# Patient Record
Sex: Male | Born: 1940 | Race: Black or African American | Hispanic: No | Marital: Single | Smoking: Current every day smoker
Health system: Southern US, Community
[De-identification: ages and names within clinical notes are randomized; demographics above are authoritative.]

## PROBLEM LIST (undated history)

## (undated) DIAGNOSIS — R319 Hematuria, unspecified: Secondary | ICD-10-CM

## (undated) DIAGNOSIS — R0602 Shortness of breath: Secondary | ICD-10-CM

## (undated) DIAGNOSIS — J189 Pneumonia, unspecified organism: Secondary | ICD-10-CM

## (undated) DIAGNOSIS — R569 Unspecified convulsions: Secondary | ICD-10-CM

## (undated) DIAGNOSIS — H547 Unspecified visual loss: Secondary | ICD-10-CM

## (undated) DIAGNOSIS — J449 Chronic obstructive pulmonary disease, unspecified: Secondary | ICD-10-CM

## (undated) DIAGNOSIS — I10 Essential (primary) hypertension: Secondary | ICD-10-CM

## (undated) DIAGNOSIS — R51 Headache: Secondary | ICD-10-CM

---

## 2009-06-14 DEATH — deceased

## 2010-08-14 DIAGNOSIS — R319 Hematuria, unspecified: Secondary | ICD-10-CM

## 2010-08-14 DIAGNOSIS — J189 Pneumonia, unspecified organism: Secondary | ICD-10-CM

## 2010-08-14 HISTORY — PX: CYSTOSCOPY: SHX5120

## 2010-08-14 HISTORY — DX: Hematuria, unspecified: R31.9

## 2010-08-14 HISTORY — DX: Pneumonia, unspecified organism: J18.9

## 2011-09-22 ENCOUNTER — Encounter (HOSPITAL_COMMUNITY): Payer: Self-pay | Admitting: Pharmacy Technician

## 2011-09-26 ENCOUNTER — Other Ambulatory Visit (HOSPITAL_COMMUNITY): Payer: Self-pay | Admitting: *Deleted

## 2011-09-27 ENCOUNTER — Inpatient Hospital Stay (HOSPITAL_COMMUNITY): Admission: RE | Admit: 2011-09-27 | Discharge: 2011-09-27 | Payer: Self-pay | Source: Ambulatory Visit

## 2011-09-27 ENCOUNTER — Other Ambulatory Visit (HOSPITAL_COMMUNITY): Payer: Self-pay | Admitting: *Deleted

## 2011-09-27 NOTE — Progress Notes (Signed)
Pt was not able to make PAT appt today , it has been rescheduled for Fri, February 15th.

## 2011-09-29 ENCOUNTER — Other Ambulatory Visit: Payer: Self-pay

## 2011-09-29 ENCOUNTER — Encounter (HOSPITAL_COMMUNITY)
Admission: RE | Admit: 2011-09-29 | Discharge: 2011-09-29 | Disposition: A | Discharge status: Home / Self Care | Payer: Medicare Other | Source: Ambulatory Visit | Attending: Oral Surgery | Admitting: Oral Surgery

## 2011-09-29 ENCOUNTER — Encounter (HOSPITAL_COMMUNITY): Payer: Self-pay

## 2011-09-29 ENCOUNTER — Ambulatory Visit (HOSPITAL_COMMUNITY)
Admission: RE | Admit: 2011-09-29 | Discharge: 2011-09-29 | Disposition: A | Discharge status: Home / Self Care | Payer: Medicare Other | Source: Ambulatory Visit | Attending: Anesthesiology | Admitting: Anesthesiology

## 2011-09-29 DIAGNOSIS — Z01810 Encounter for preprocedural cardiovascular examination: Secondary | ICD-10-CM | POA: Insufficient documentation

## 2011-09-29 DIAGNOSIS — Z01818 Encounter for other preprocedural examination: Secondary | ICD-10-CM | POA: Insufficient documentation

## 2011-09-29 DIAGNOSIS — Z01812 Encounter for preprocedural laboratory examination: Secondary | ICD-10-CM | POA: Insufficient documentation

## 2011-09-29 HISTORY — DX: Headache: R51

## 2011-09-29 HISTORY — DX: Pneumonia, unspecified organism: J18.9

## 2011-09-29 HISTORY — DX: Chronic obstructive pulmonary disease, unspecified: J44.9

## 2011-09-29 HISTORY — DX: Essential (primary) hypertension: I10

## 2011-09-29 HISTORY — DX: Shortness of breath: R06.02

## 2011-09-29 HISTORY — DX: Unspecified visual loss: H54.7

## 2011-09-29 HISTORY — DX: Unspecified convulsions: R56.9

## 2011-09-29 HISTORY — DX: Hematuria, unspecified: R31.9

## 2011-09-29 LAB — COMPREHENSIVE METABOLIC PANEL
ALT: 7 U/L (ref 0–53)
AST: 15 U/L (ref 0–37)
Albumin: 4.3 g/dL (ref 3.5–5.2)
CO2: 24 mEq/L (ref 19–32)
Chloride: 101 mEq/L (ref 96–112)
Creatinine, Ser: 0.95 mg/dL (ref 0.50–1.35)
GFR calc non Af Amer: 84 mL/min — ABNORMAL LOW (ref >90)
Potassium: 4.4 mEq/L (ref 3.5–5.1)
Sodium: 135 mEq/L (ref 135–145)
Total Bilirubin: 0.3 mg/dL (ref 0.3–1.2)

## 2011-09-29 LAB — CBC
Platelets: 337 10*3/uL (ref 150–400)
RBC: 4.45 MIL/uL (ref 4.22–5.81)
RDW: 14.5 % (ref 11.5–15.5)
WBC: 6.3 10*3/uL (ref 4.0–10.5)

## 2011-09-29 LAB — SURGICAL PCR SCREEN
MRSA, PCR: NEGATIVE
Staphylococcus aureus: NEGATIVE

## 2011-09-29 NOTE — Progress Notes (Signed)
Pt is a poor historian.  Unsure about procedures he had in the past..  Pt state's that he used to drink too much, but quit last year.  CMET requested.

## 2011-09-29 NOTE — Pre-Procedure Instructions (Signed)
20 Marc Morris  09/29/2011   Your procedure is scheduled on:  Monday, February 18th  Report to [facility] at 8:00am .    Call this number if you have problems the morning of surgery: [phone]   Remember:   Do not eat food:After Midnight.  May have clear liquids: up to 4 Hours before arrival.  Clear liquids include soda, tea, black coffee, apple or grape juice, broth.  Take these medicines the morning of surgery with A SIP OF WATER: Alprazolam, Metoprolol, Tamsulosin, Nexium.  Use Albuterol Inhaler.   Do not wear jewelry, make-up or nail polish.  Do not wear lotions, powders, or perfumes. You may wear deodorant.  Do not shave 48 hours prior to surgery.  Do not bring valuables to the hospital.  Contacts, dentures or bridgework may not be worn into surgery.  Leave suitcase in the car. After surgery it may be brought to your room.  For patients admitted to the hospital, checkout time is 11:00 AM the day of discharge.   Patients discharged the day of surgery will not be allowed to drive home.  Name and phone number of your driver: --    Special Instructions: CHG Shower Use Special Wash: 1/2 bottle night before surgery and 1/2 bottle morning of surgery.   Please read over the following fact sheets that you were given: Pain Booklet, Coughing and Deep Breathing, MRSA Information and Surgical Site Infection Prevention

## 2011-10-02 ENCOUNTER — Ambulatory Visit (HOSPITAL_COMMUNITY): Admission: RE | Admit: 2011-10-02 | Payer: Medicare Other | Source: Ambulatory Visit | Admitting: Oral Surgery

## 2011-10-02 ENCOUNTER — Encounter (HOSPITAL_COMMUNITY): Admission: RE | Payer: Self-pay | Source: Ambulatory Visit

## 2011-10-02 SURGERY — EXTRACTION, TOOTH, MOLAR
Anesthesia: General | Laterality: Left

## 2013-01-23 IMAGING — CR DG CHEST 2V
2 series · 2 of 2 positions shown · non-contrast
Comparison: None.

CLINICAL DATA: Preop for dental surgery, smoking history

CHEST - 2 VIEW

[w chest pa]
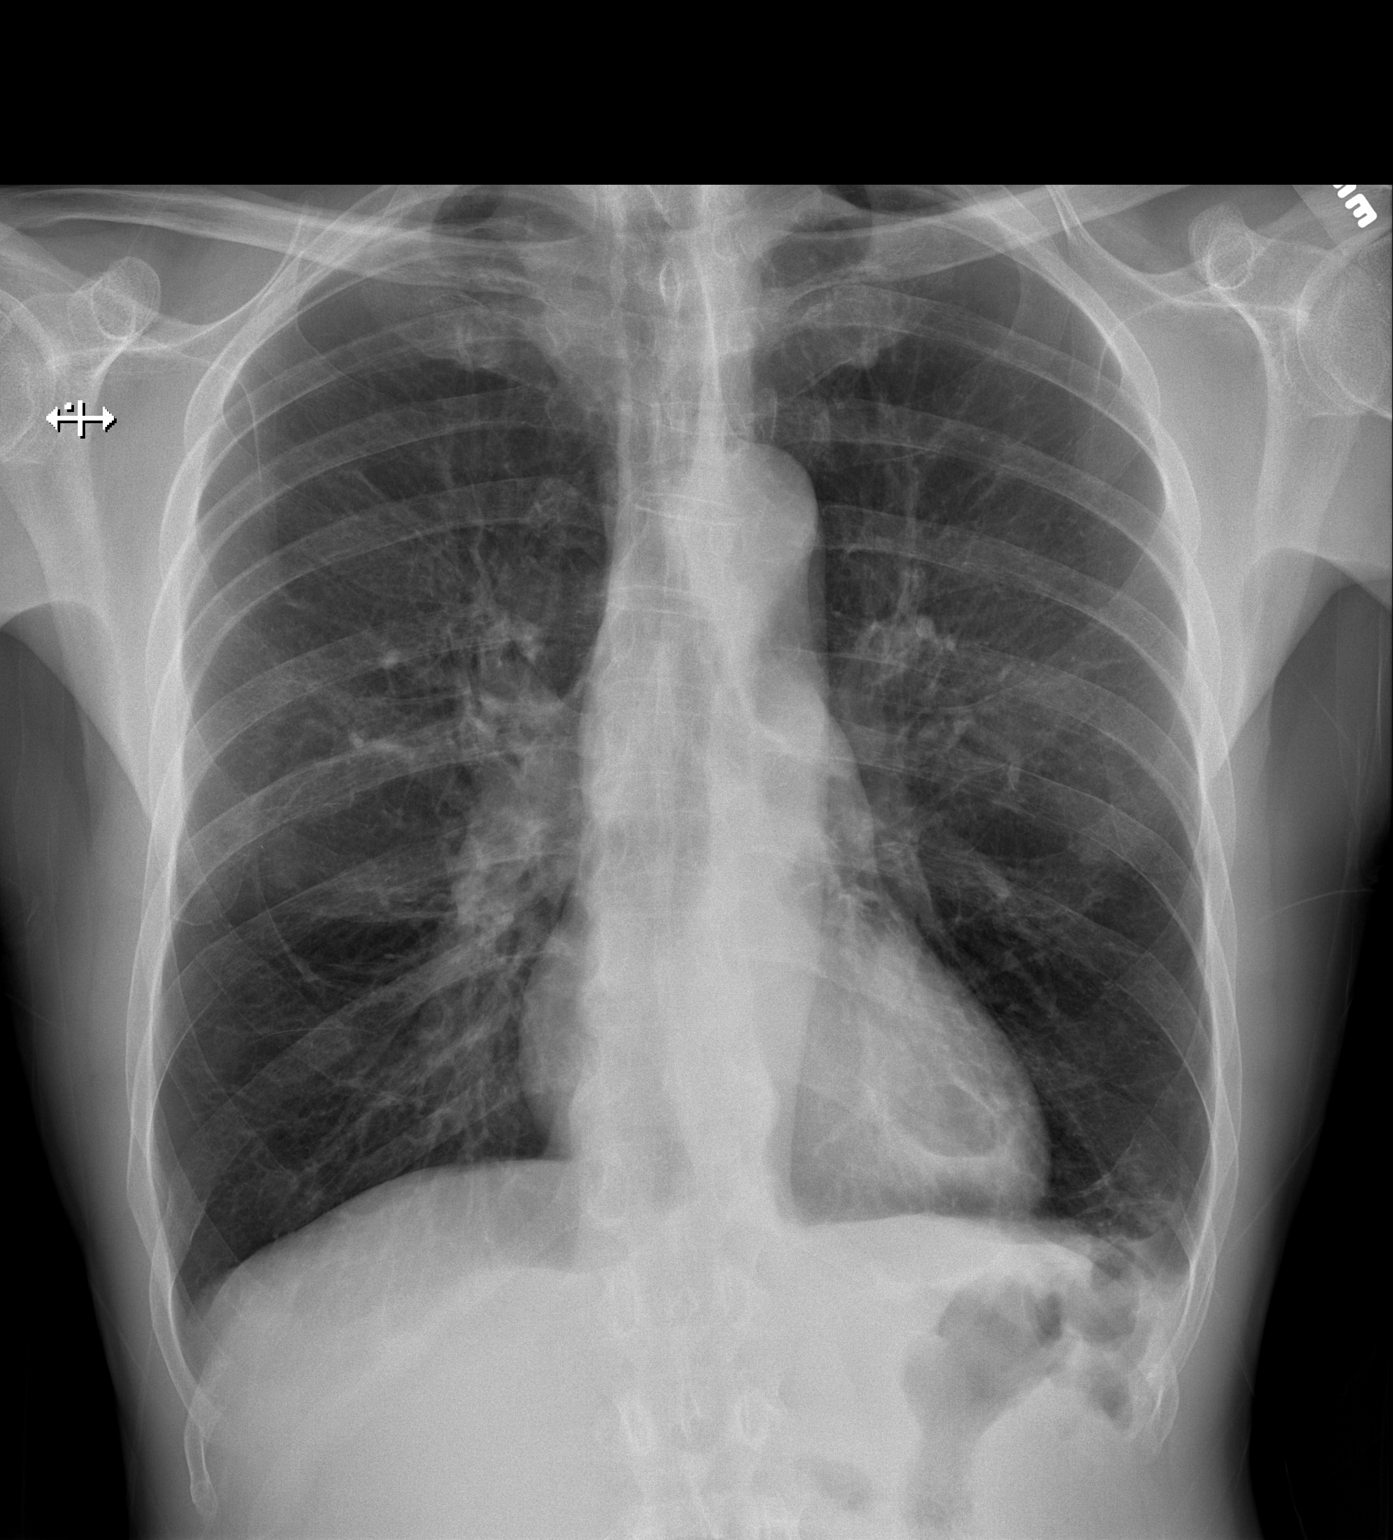

[w chest lat]
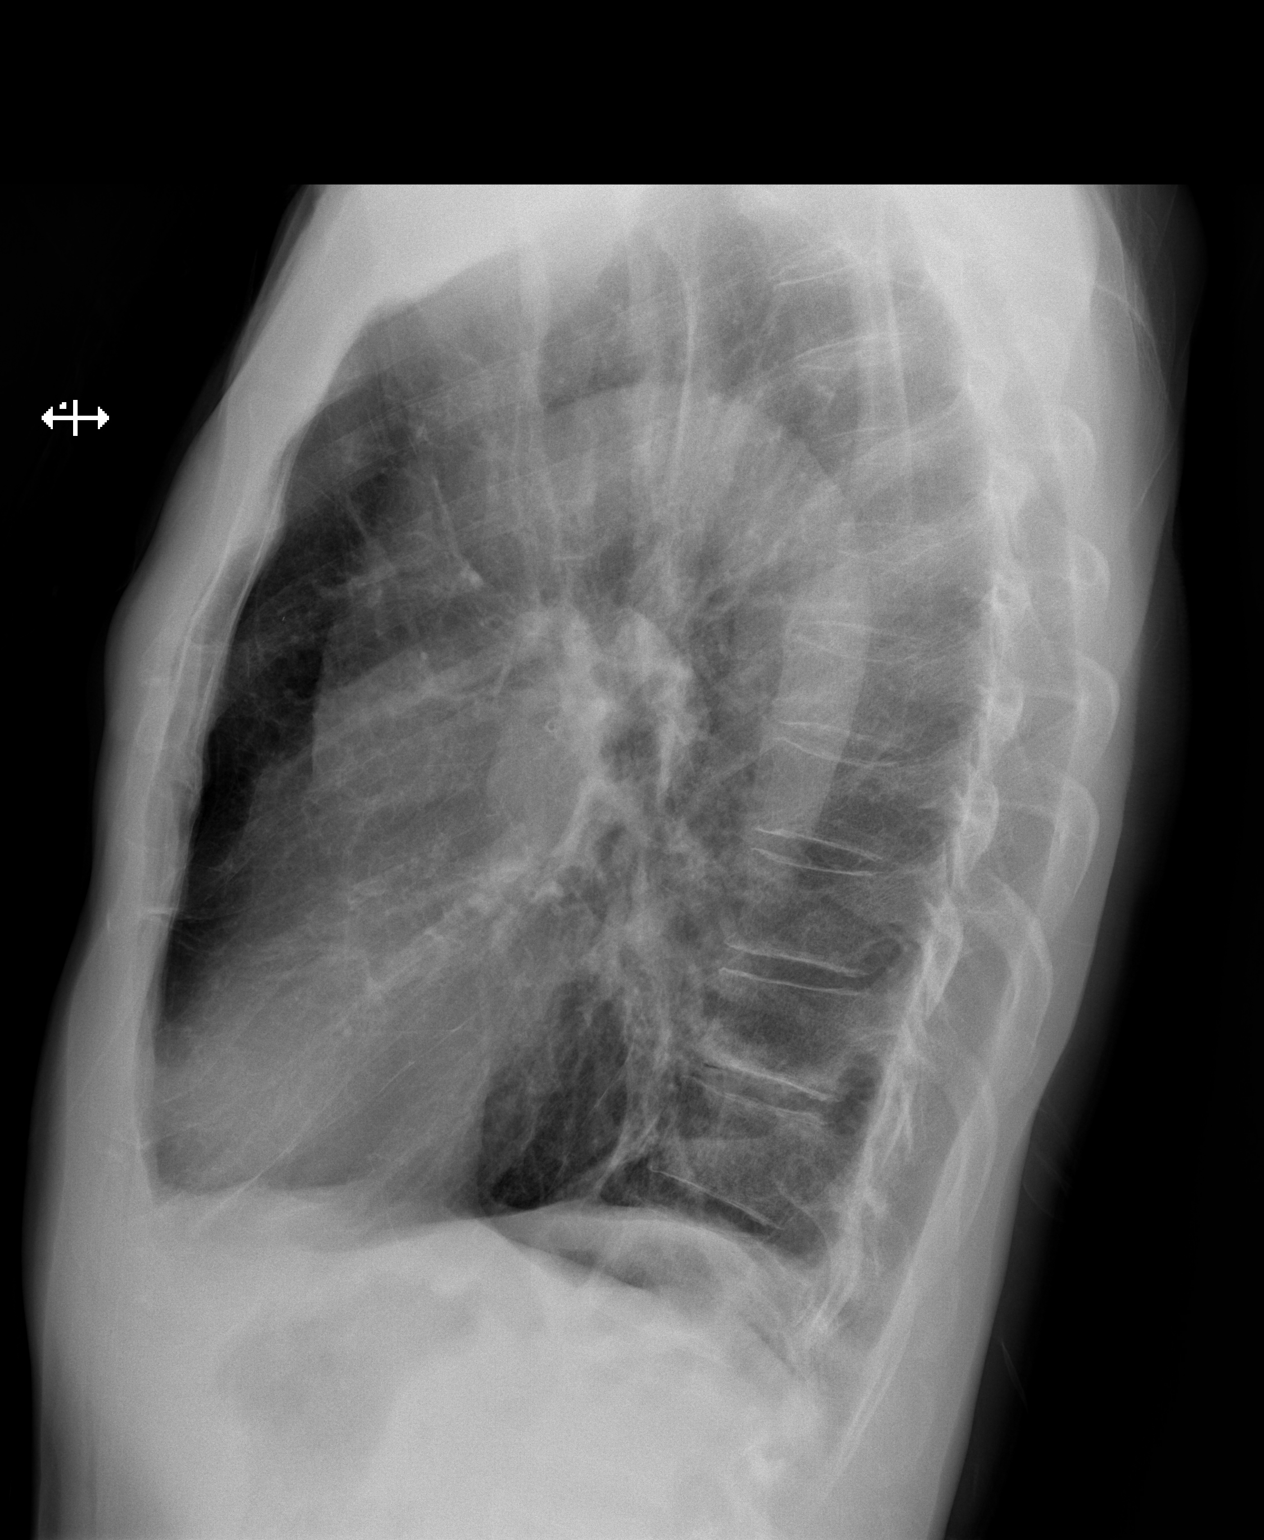

[2 of 2 positions shown; findings below may reference images not displayed]

FINDINGS: The lungs are hyperaerated consistent with COPD.
However, medially at the left lung base there is an air collection
with an air-fluid level present within the left lower lobe. Since
this lesion appears to be a thin-walled, I would favor an infected
bullous over abscess.  Linear atelectasis or scarring is present at
the left lung base as well.  CT of the chest with IV contrast media
is recommended to assess further. The heart is within normal limits
in size.  No bony abnormality is seen.
IMPRESSION: 1.  Lesion at the left lung base with an air-fluid level.  Favor
infected bullous over abscess.  Recommend CT of the chest with IV
contrast media.
2.  COPD.

## 2016-01-22 DIAGNOSIS — J449 Chronic obstructive pulmonary disease, unspecified: Secondary | ICD-10-CM | POA: Diagnosis not present

## 2016-01-22 DIAGNOSIS — J690 Pneumonitis due to inhalation of food and vomit: Secondary | ICD-10-CM

## 2016-01-22 DIAGNOSIS — I2699 Other pulmonary embolism without acute cor pulmonale: Secondary | ICD-10-CM

## 2016-01-22 DIAGNOSIS — D638 Anemia in other chronic diseases classified elsewhere: Secondary | ICD-10-CM | POA: Diagnosis not present

## 2016-01-22 DIAGNOSIS — R64 Cachexia: Secondary | ICD-10-CM

## 2016-01-22 DIAGNOSIS — G92 Toxic encephalopathy: Secondary | ICD-10-CM

## 2016-01-22 DIAGNOSIS — J9621 Acute and chronic respiratory failure with hypoxia: Secondary | ICD-10-CM

## 2016-01-22 DIAGNOSIS — F1020 Alcohol dependence, uncomplicated: Secondary | ICD-10-CM

## 2016-01-22 DIAGNOSIS — K219 Gastro-esophageal reflux disease without esophagitis: Secondary | ICD-10-CM

## 2016-01-22 DIAGNOSIS — I10 Essential (primary) hypertension: Secondary | ICD-10-CM

## 2016-01-23 DIAGNOSIS — J690 Pneumonitis due to inhalation of food and vomit: Secondary | ICD-10-CM | POA: Diagnosis not present

## 2016-01-23 DIAGNOSIS — D638 Anemia in other chronic diseases classified elsewhere: Secondary | ICD-10-CM | POA: Diagnosis not present

## 2016-01-23 DIAGNOSIS — J9621 Acute and chronic respiratory failure with hypoxia: Secondary | ICD-10-CM | POA: Diagnosis not present

## 2016-01-23 DIAGNOSIS — J449 Chronic obstructive pulmonary disease, unspecified: Secondary | ICD-10-CM | POA: Diagnosis not present

## 2016-01-24 DIAGNOSIS — D638 Anemia in other chronic diseases classified elsewhere: Secondary | ICD-10-CM | POA: Diagnosis not present

## 2016-01-24 DIAGNOSIS — J9621 Acute and chronic respiratory failure with hypoxia: Secondary | ICD-10-CM | POA: Diagnosis not present

## 2016-01-24 DIAGNOSIS — J449 Chronic obstructive pulmonary disease, unspecified: Secondary | ICD-10-CM | POA: Diagnosis not present

## 2016-01-24 DIAGNOSIS — J690 Pneumonitis due to inhalation of food and vomit: Secondary | ICD-10-CM | POA: Diagnosis not present

## 2016-01-25 DIAGNOSIS — J449 Chronic obstructive pulmonary disease, unspecified: Secondary | ICD-10-CM | POA: Diagnosis not present

## 2016-01-25 DIAGNOSIS — J690 Pneumonitis due to inhalation of food and vomit: Secondary | ICD-10-CM | POA: Diagnosis not present

## 2016-01-25 DIAGNOSIS — D638 Anemia in other chronic diseases classified elsewhere: Secondary | ICD-10-CM | POA: Diagnosis not present

## 2016-01-25 DIAGNOSIS — J9621 Acute and chronic respiratory failure with hypoxia: Secondary | ICD-10-CM | POA: Diagnosis not present

## 2016-01-26 DIAGNOSIS — J449 Chronic obstructive pulmonary disease, unspecified: Secondary | ICD-10-CM | POA: Diagnosis not present

## 2016-01-26 DIAGNOSIS — J9621 Acute and chronic respiratory failure with hypoxia: Secondary | ICD-10-CM | POA: Diagnosis not present

## 2016-01-26 DIAGNOSIS — J690 Pneumonitis due to inhalation of food and vomit: Secondary | ICD-10-CM | POA: Diagnosis not present

## 2016-01-26 DIAGNOSIS — D638 Anemia in other chronic diseases classified elsewhere: Secondary | ICD-10-CM | POA: Diagnosis not present

## 2016-01-27 DIAGNOSIS — G92 Toxic encephalopathy: Secondary | ICD-10-CM

## 2016-01-27 DIAGNOSIS — J690 Pneumonitis due to inhalation of food and vomit: Secondary | ICD-10-CM

## 2016-01-27 DIAGNOSIS — I2699 Other pulmonary embolism without acute cor pulmonale: Secondary | ICD-10-CM

## 2016-01-27 DIAGNOSIS — F1020 Alcohol dependence, uncomplicated: Secondary | ICD-10-CM

## 2016-01-27 DIAGNOSIS — J449 Chronic obstructive pulmonary disease, unspecified: Secondary | ICD-10-CM | POA: Diagnosis not present

## 2016-01-27 DIAGNOSIS — K219 Gastro-esophageal reflux disease without esophagitis: Secondary | ICD-10-CM

## 2016-01-27 DIAGNOSIS — D638 Anemia in other chronic diseases classified elsewhere: Secondary | ICD-10-CM | POA: Diagnosis not present

## 2016-01-27 DIAGNOSIS — J9621 Acute and chronic respiratory failure with hypoxia: Secondary | ICD-10-CM

## 2016-01-27 DIAGNOSIS — R64 Cachexia: Secondary | ICD-10-CM

## 2016-01-27 DIAGNOSIS — I10 Essential (primary) hypertension: Secondary | ICD-10-CM

## 2016-03-29 DIAGNOSIS — Z720 Tobacco use: Secondary | ICD-10-CM

## 2016-03-29 DIAGNOSIS — D638 Anemia in other chronic diseases classified elsewhere: Secondary | ICD-10-CM

## 2016-03-29 DIAGNOSIS — I2699 Other pulmonary embolism without acute cor pulmonale: Secondary | ICD-10-CM

## 2016-03-29 DIAGNOSIS — K922 Gastrointestinal hemorrhage, unspecified: Secondary | ICD-10-CM | POA: Diagnosis not present

## 2016-03-29 DIAGNOSIS — K219 Gastro-esophageal reflux disease without esophagitis: Secondary | ICD-10-CM

## 2016-03-29 DIAGNOSIS — R42 Dizziness and giddiness: Secondary | ICD-10-CM | POA: Diagnosis not present

## 2016-03-29 DIAGNOSIS — R0600 Dyspnea, unspecified: Secondary | ICD-10-CM | POA: Diagnosis not present

## 2016-03-29 DIAGNOSIS — R531 Weakness: Secondary | ICD-10-CM | POA: Diagnosis not present

## 2016-03-29 DIAGNOSIS — R64 Cachexia: Secondary | ICD-10-CM

## 2016-03-29 DIAGNOSIS — I10 Essential (primary) hypertension: Secondary | ICD-10-CM

## 2016-03-30 DIAGNOSIS — R0600 Dyspnea, unspecified: Secondary | ICD-10-CM | POA: Diagnosis not present

## 2016-03-30 DIAGNOSIS — K922 Gastrointestinal hemorrhage, unspecified: Secondary | ICD-10-CM | POA: Diagnosis not present

## 2016-03-30 DIAGNOSIS — R531 Weakness: Secondary | ICD-10-CM | POA: Diagnosis not present

## 2016-03-30 DIAGNOSIS — R42 Dizziness and giddiness: Secondary | ICD-10-CM | POA: Diagnosis not present

## 2016-03-31 DIAGNOSIS — R531 Weakness: Secondary | ICD-10-CM | POA: Diagnosis not present

## 2016-03-31 DIAGNOSIS — K922 Gastrointestinal hemorrhage, unspecified: Secondary | ICD-10-CM | POA: Diagnosis not present

## 2016-03-31 DIAGNOSIS — R42 Dizziness and giddiness: Secondary | ICD-10-CM | POA: Diagnosis not present

## 2016-03-31 DIAGNOSIS — R0600 Dyspnea, unspecified: Secondary | ICD-10-CM | POA: Diagnosis not present

## 2016-04-01 DIAGNOSIS — R531 Weakness: Secondary | ICD-10-CM | POA: Diagnosis not present

## 2016-04-01 DIAGNOSIS — K922 Gastrointestinal hemorrhage, unspecified: Secondary | ICD-10-CM | POA: Diagnosis not present

## 2016-04-01 DIAGNOSIS — R0600 Dyspnea, unspecified: Secondary | ICD-10-CM | POA: Diagnosis not present

## 2016-04-01 DIAGNOSIS — R42 Dizziness and giddiness: Secondary | ICD-10-CM | POA: Diagnosis not present

## 2017-09-16 DIAGNOSIS — W19XXXA Unspecified fall, initial encounter: Secondary | ICD-10-CM

## 2017-09-16 DIAGNOSIS — Z720 Tobacco use: Secondary | ICD-10-CM | POA: Diagnosis not present

## 2017-09-16 DIAGNOSIS — F10129 Alcohol abuse with intoxication, unspecified: Secondary | ICD-10-CM

## 2017-09-16 DIAGNOSIS — I10 Essential (primary) hypertension: Secondary | ICD-10-CM | POA: Diagnosis not present

## 2017-09-16 DIAGNOSIS — J449 Chronic obstructive pulmonary disease, unspecified: Secondary | ICD-10-CM | POA: Diagnosis not present

## 2017-09-16 DIAGNOSIS — J690 Pneumonitis due to inhalation of food and vomit: Secondary | ICD-10-CM

## 2017-09-16 DIAGNOSIS — S2231XA Fracture of one rib, right side, initial encounter for closed fracture: Secondary | ICD-10-CM | POA: Diagnosis not present

## 2017-09-16 DIAGNOSIS — J9690 Respiratory failure, unspecified, unspecified whether with hypoxia or hypercapnia: Secondary | ICD-10-CM | POA: Diagnosis not present

## 2017-09-17 DIAGNOSIS — S2231XA Fracture of one rib, right side, initial encounter for closed fracture: Secondary | ICD-10-CM | POA: Diagnosis not present

## 2017-09-17 DIAGNOSIS — F10129 Alcohol abuse with intoxication, unspecified: Secondary | ICD-10-CM | POA: Diagnosis not present

## 2017-09-17 DIAGNOSIS — J690 Pneumonitis due to inhalation of food and vomit: Secondary | ICD-10-CM | POA: Diagnosis not present

## 2017-09-17 DIAGNOSIS — W19XXXA Unspecified fall, initial encounter: Secondary | ICD-10-CM | POA: Diagnosis not present

## 2017-09-18 DIAGNOSIS — J690 Pneumonitis due to inhalation of food and vomit: Secondary | ICD-10-CM | POA: Diagnosis not present

## 2017-09-18 DIAGNOSIS — W19XXXA Unspecified fall, initial encounter: Secondary | ICD-10-CM | POA: Diagnosis not present

## 2017-09-18 DIAGNOSIS — F10129 Alcohol abuse with intoxication, unspecified: Secondary | ICD-10-CM | POA: Diagnosis not present

## 2017-09-18 DIAGNOSIS — S2231XA Fracture of one rib, right side, initial encounter for closed fracture: Secondary | ICD-10-CM | POA: Diagnosis not present

## 2017-09-19 DIAGNOSIS — J9690 Respiratory failure, unspecified, unspecified whether with hypoxia or hypercapnia: Secondary | ICD-10-CM | POA: Diagnosis not present

## 2017-09-19 DIAGNOSIS — Z720 Tobacco use: Secondary | ICD-10-CM | POA: Diagnosis not present

## 2017-09-19 DIAGNOSIS — S2231XA Fracture of one rib, right side, initial encounter for closed fracture: Secondary | ICD-10-CM | POA: Diagnosis not present

## 2017-09-19 DIAGNOSIS — W19XXXA Unspecified fall, initial encounter: Secondary | ICD-10-CM | POA: Diagnosis not present

## 2017-09-19 DIAGNOSIS — J690 Pneumonitis due to inhalation of food and vomit: Secondary | ICD-10-CM | POA: Diagnosis not present

## 2017-09-19 DIAGNOSIS — F10129 Alcohol abuse with intoxication, unspecified: Secondary | ICD-10-CM | POA: Diagnosis not present

## 2017-09-19 DIAGNOSIS — I10 Essential (primary) hypertension: Secondary | ICD-10-CM | POA: Diagnosis not present

## 2017-09-19 DIAGNOSIS — J449 Chronic obstructive pulmonary disease, unspecified: Secondary | ICD-10-CM | POA: Diagnosis not present

## 2017-09-20 DIAGNOSIS — W19XXXA Unspecified fall, initial encounter: Secondary | ICD-10-CM | POA: Diagnosis not present

## 2017-09-20 DIAGNOSIS — J690 Pneumonitis due to inhalation of food and vomit: Secondary | ICD-10-CM | POA: Diagnosis not present

## 2017-09-20 DIAGNOSIS — S2231XA Fracture of one rib, right side, initial encounter for closed fracture: Secondary | ICD-10-CM | POA: Diagnosis not present

## 2017-09-20 DIAGNOSIS — F10129 Alcohol abuse with intoxication, unspecified: Secondary | ICD-10-CM | POA: Diagnosis not present

## 2017-09-21 DIAGNOSIS — W19XXXA Unspecified fall, initial encounter: Secondary | ICD-10-CM | POA: Diagnosis not present

## 2017-09-21 DIAGNOSIS — S2231XA Fracture of one rib, right side, initial encounter for closed fracture: Secondary | ICD-10-CM | POA: Diagnosis not present

## 2017-09-21 DIAGNOSIS — J690 Pneumonitis due to inhalation of food and vomit: Secondary | ICD-10-CM | POA: Diagnosis not present

## 2017-09-21 DIAGNOSIS — F10129 Alcohol abuse with intoxication, unspecified: Secondary | ICD-10-CM | POA: Diagnosis not present

## 2017-09-22 DIAGNOSIS — F10129 Alcohol abuse with intoxication, unspecified: Secondary | ICD-10-CM | POA: Diagnosis not present

## 2017-09-22 DIAGNOSIS — S2231XA Fracture of one rib, right side, initial encounter for closed fracture: Secondary | ICD-10-CM | POA: Diagnosis not present

## 2017-09-22 DIAGNOSIS — J690 Pneumonitis due to inhalation of food and vomit: Secondary | ICD-10-CM | POA: Diagnosis not present

## 2017-09-22 DIAGNOSIS — W19XXXA Unspecified fall, initial encounter: Secondary | ICD-10-CM | POA: Diagnosis not present

## 2017-09-23 DIAGNOSIS — S2231XA Fracture of one rib, right side, initial encounter for closed fracture: Secondary | ICD-10-CM | POA: Diagnosis not present

## 2017-09-23 DIAGNOSIS — J690 Pneumonitis due to inhalation of food and vomit: Secondary | ICD-10-CM | POA: Diagnosis not present

## 2017-09-23 DIAGNOSIS — F10129 Alcohol abuse with intoxication, unspecified: Secondary | ICD-10-CM | POA: Diagnosis not present

## 2017-09-23 DIAGNOSIS — W19XXXA Unspecified fall, initial encounter: Secondary | ICD-10-CM | POA: Diagnosis not present

## 2018-02-16 DIAGNOSIS — E162 Hypoglycemia, unspecified: Secondary | ICD-10-CM

## 2018-02-16 DIAGNOSIS — R112 Nausea with vomiting, unspecified: Secondary | ICD-10-CM | POA: Diagnosis not present

## 2018-02-16 DIAGNOSIS — K219 Gastro-esophageal reflux disease without esophagitis: Secondary | ICD-10-CM

## 2018-02-16 DIAGNOSIS — W19XXXA Unspecified fall, initial encounter: Secondary | ICD-10-CM | POA: Diagnosis not present

## 2018-02-16 DIAGNOSIS — F1020 Alcohol dependence, uncomplicated: Secondary | ICD-10-CM

## 2018-02-16 DIAGNOSIS — I10 Essential (primary) hypertension: Secondary | ICD-10-CM

## 2018-02-16 DIAGNOSIS — E860 Dehydration: Secondary | ICD-10-CM | POA: Diagnosis not present

## 2018-02-16 DIAGNOSIS — Z720 Tobacco use: Secondary | ICD-10-CM

## 2018-02-16 DIAGNOSIS — I2699 Other pulmonary embolism without acute cor pulmonale: Secondary | ICD-10-CM

## 2018-02-17 DIAGNOSIS — E440 Moderate protein-calorie malnutrition: Secondary | ICD-10-CM

## 2018-02-17 DIAGNOSIS — E860 Dehydration: Secondary | ICD-10-CM | POA: Diagnosis not present

## 2018-02-17 DIAGNOSIS — J984 Other disorders of lung: Secondary | ICD-10-CM

## 2018-02-17 DIAGNOSIS — W19XXXA Unspecified fall, initial encounter: Secondary | ICD-10-CM | POA: Diagnosis not present

## 2018-02-17 DIAGNOSIS — N400 Enlarged prostate without lower urinary tract symptoms: Secondary | ICD-10-CM

## 2018-02-17 DIAGNOSIS — R296 Repeated falls: Secondary | ICD-10-CM | POA: Diagnosis not present

## 2018-02-17 DIAGNOSIS — I480 Paroxysmal atrial fibrillation: Secondary | ICD-10-CM

## 2018-02-17 DIAGNOSIS — R531 Weakness: Secondary | ICD-10-CM

## 2018-02-17 DIAGNOSIS — F329 Major depressive disorder, single episode, unspecified: Secondary | ICD-10-CM

## 2018-02-17 DIAGNOSIS — F0390 Unspecified dementia without behavioral disturbance: Secondary | ICD-10-CM

## 2018-02-17 DIAGNOSIS — R748 Abnormal levels of other serum enzymes: Secondary | ICD-10-CM

## 2018-02-17 DIAGNOSIS — J449 Chronic obstructive pulmonary disease, unspecified: Secondary | ICD-10-CM

## 2018-02-17 DIAGNOSIS — I5032 Chronic diastolic (congestive) heart failure: Secondary | ICD-10-CM

## 2018-02-18 DIAGNOSIS — W19XXXA Unspecified fall, initial encounter: Secondary | ICD-10-CM | POA: Diagnosis not present

## 2018-02-18 DIAGNOSIS — R296 Repeated falls: Secondary | ICD-10-CM | POA: Diagnosis not present

## 2018-02-18 DIAGNOSIS — E860 Dehydration: Secondary | ICD-10-CM | POA: Diagnosis not present

## 2018-02-18 DIAGNOSIS — J984 Other disorders of lung: Secondary | ICD-10-CM | POA: Diagnosis not present

## 2018-02-19 DIAGNOSIS — J984 Other disorders of lung: Secondary | ICD-10-CM | POA: Diagnosis not present

## 2018-02-19 DIAGNOSIS — W19XXXA Unspecified fall, initial encounter: Secondary | ICD-10-CM | POA: Diagnosis not present

## 2018-02-19 DIAGNOSIS — E860 Dehydration: Secondary | ICD-10-CM | POA: Diagnosis not present

## 2018-02-19 DIAGNOSIS — R296 Repeated falls: Secondary | ICD-10-CM | POA: Diagnosis not present

## 2018-08-08 DIAGNOSIS — D638 Anemia in other chronic diseases classified elsewhere: Secondary | ICD-10-CM

## 2018-08-08 DIAGNOSIS — F1020 Alcohol dependence, uncomplicated: Secondary | ICD-10-CM | POA: Diagnosis not present

## 2018-08-08 DIAGNOSIS — I480 Paroxysmal atrial fibrillation: Secondary | ICD-10-CM

## 2018-08-08 DIAGNOSIS — N400 Enlarged prostate without lower urinary tract symptoms: Secondary | ICD-10-CM

## 2018-08-08 DIAGNOSIS — J441 Chronic obstructive pulmonary disease with (acute) exacerbation: Secondary | ICD-10-CM

## 2018-08-08 DIAGNOSIS — J189 Pneumonia, unspecified organism: Secondary | ICD-10-CM

## 2018-08-08 DIAGNOSIS — I10 Essential (primary) hypertension: Secondary | ICD-10-CM

## 2018-08-08 DIAGNOSIS — K219 Gastro-esophageal reflux disease without esophagitis: Secondary | ICD-10-CM

## 2018-08-08 DIAGNOSIS — Z789 Other specified health status: Secondary | ICD-10-CM | POA: Diagnosis not present

## 2018-08-09 DIAGNOSIS — J441 Chronic obstructive pulmonary disease with (acute) exacerbation: Secondary | ICD-10-CM | POA: Diagnosis not present

## 2018-08-09 DIAGNOSIS — F1020 Alcohol dependence, uncomplicated: Secondary | ICD-10-CM | POA: Diagnosis not present

## 2018-08-09 DIAGNOSIS — J189 Pneumonia, unspecified organism: Secondary | ICD-10-CM | POA: Diagnosis not present

## 2018-08-09 DIAGNOSIS — Z789 Other specified health status: Secondary | ICD-10-CM | POA: Diagnosis not present

## 2018-08-10 DIAGNOSIS — J189 Pneumonia, unspecified organism: Secondary | ICD-10-CM | POA: Diagnosis not present

## 2018-08-10 DIAGNOSIS — F1020 Alcohol dependence, uncomplicated: Secondary | ICD-10-CM | POA: Diagnosis not present

## 2018-08-10 DIAGNOSIS — J441 Chronic obstructive pulmonary disease with (acute) exacerbation: Secondary | ICD-10-CM | POA: Diagnosis not present

## 2018-08-10 DIAGNOSIS — Z789 Other specified health status: Secondary | ICD-10-CM | POA: Diagnosis not present

## 2018-08-11 DIAGNOSIS — Z789 Other specified health status: Secondary | ICD-10-CM | POA: Diagnosis not present

## 2018-08-11 DIAGNOSIS — F1020 Alcohol dependence, uncomplicated: Secondary | ICD-10-CM | POA: Diagnosis not present

## 2018-08-11 DIAGNOSIS — J189 Pneumonia, unspecified organism: Secondary | ICD-10-CM | POA: Diagnosis not present

## 2018-08-11 DIAGNOSIS — J441 Chronic obstructive pulmonary disease with (acute) exacerbation: Secondary | ICD-10-CM | POA: Diagnosis not present

## 2018-10-14 DIAGNOSIS — F1020 Alcohol dependence, uncomplicated: Secondary | ICD-10-CM

## 2018-10-14 DIAGNOSIS — F0390 Unspecified dementia without behavioral disturbance: Secondary | ICD-10-CM

## 2018-10-14 DIAGNOSIS — I10 Essential (primary) hypertension: Secondary | ICD-10-CM

## 2018-10-14 DIAGNOSIS — J9621 Acute and chronic respiratory failure with hypoxia: Secondary | ICD-10-CM

## 2018-10-14 DIAGNOSIS — J690 Pneumonitis due to inhalation of food and vomit: Secondary | ICD-10-CM

## 2018-10-14 DIAGNOSIS — F1320 Sedative, hypnotic or anxiolytic dependence, uncomplicated: Secondary | ICD-10-CM

## 2018-10-14 DIAGNOSIS — J441 Chronic obstructive pulmonary disease with (acute) exacerbation: Secondary | ICD-10-CM

## 2018-10-14 DIAGNOSIS — N400 Enlarged prostate without lower urinary tract symptoms: Secondary | ICD-10-CM

## 2018-10-14 DIAGNOSIS — F329 Major depressive disorder, single episode, unspecified: Secondary | ICD-10-CM

## 2018-10-14 DIAGNOSIS — I480 Paroxysmal atrial fibrillation: Secondary | ICD-10-CM

## 2018-10-15 DIAGNOSIS — J9621 Acute and chronic respiratory failure with hypoxia: Secondary | ICD-10-CM | POA: Diagnosis not present

## 2018-10-15 DIAGNOSIS — F1320 Sedative, hypnotic or anxiolytic dependence, uncomplicated: Secondary | ICD-10-CM | POA: Diagnosis not present

## 2018-12-28 DIAGNOSIS — J449 Chronic obstructive pulmonary disease, unspecified: Secondary | ICD-10-CM | POA: Diagnosis not present

## 2018-12-28 DIAGNOSIS — E871 Hypo-osmolality and hyponatremia: Secondary | ICD-10-CM

## 2018-12-28 DIAGNOSIS — F1010 Alcohol abuse, uncomplicated: Secondary | ICD-10-CM | POA: Diagnosis not present

## 2018-12-28 DIAGNOSIS — R197 Diarrhea, unspecified: Secondary | ICD-10-CM

## 2018-12-28 DIAGNOSIS — J9621 Acute and chronic respiratory failure with hypoxia: Secondary | ICD-10-CM | POA: Diagnosis not present

## 2018-12-29 DIAGNOSIS — R197 Diarrhea, unspecified: Secondary | ICD-10-CM | POA: Diagnosis not present

## 2018-12-29 DIAGNOSIS — J9621 Acute and chronic respiratory failure with hypoxia: Secondary | ICD-10-CM | POA: Diagnosis not present

## 2018-12-29 DIAGNOSIS — F1010 Alcohol abuse, uncomplicated: Secondary | ICD-10-CM | POA: Diagnosis not present

## 2018-12-29 DIAGNOSIS — J449 Chronic obstructive pulmonary disease, unspecified: Secondary | ICD-10-CM | POA: Diagnosis not present

## 2018-12-30 DIAGNOSIS — F1010 Alcohol abuse, uncomplicated: Secondary | ICD-10-CM | POA: Diagnosis not present

## 2018-12-30 DIAGNOSIS — R197 Diarrhea, unspecified: Secondary | ICD-10-CM | POA: Diagnosis not present

## 2018-12-30 DIAGNOSIS — J449 Chronic obstructive pulmonary disease, unspecified: Secondary | ICD-10-CM | POA: Diagnosis not present

## 2018-12-30 DIAGNOSIS — J9621 Acute and chronic respiratory failure with hypoxia: Secondary | ICD-10-CM | POA: Diagnosis not present

## 2018-12-31 DIAGNOSIS — J449 Chronic obstructive pulmonary disease, unspecified: Secondary | ICD-10-CM | POA: Diagnosis not present

## 2018-12-31 DIAGNOSIS — F1010 Alcohol abuse, uncomplicated: Secondary | ICD-10-CM | POA: Diagnosis not present

## 2018-12-31 DIAGNOSIS — J9621 Acute and chronic respiratory failure with hypoxia: Secondary | ICD-10-CM | POA: Diagnosis not present

## 2018-12-31 DIAGNOSIS — R197 Diarrhea, unspecified: Secondary | ICD-10-CM | POA: Diagnosis not present

## 2019-01-01 DIAGNOSIS — E871 Hypo-osmolality and hyponatremia: Secondary | ICD-10-CM | POA: Diagnosis not present

## 2019-01-01 DIAGNOSIS — J9621 Acute and chronic respiratory failure with hypoxia: Secondary | ICD-10-CM | POA: Diagnosis not present

## 2019-01-01 DIAGNOSIS — J690 Pneumonitis due to inhalation of food and vomit: Secondary | ICD-10-CM | POA: Diagnosis not present

## 2019-01-01 DIAGNOSIS — F1010 Alcohol abuse, uncomplicated: Secondary | ICD-10-CM | POA: Diagnosis not present

## 2019-01-02 DIAGNOSIS — E871 Hypo-osmolality and hyponatremia: Secondary | ICD-10-CM | POA: Diagnosis not present

## 2019-01-02 DIAGNOSIS — F1010 Alcohol abuse, uncomplicated: Secondary | ICD-10-CM | POA: Diagnosis not present

## 2019-01-02 DIAGNOSIS — J9621 Acute and chronic respiratory failure with hypoxia: Secondary | ICD-10-CM | POA: Diagnosis not present

## 2019-01-02 DIAGNOSIS — J690 Pneumonitis due to inhalation of food and vomit: Secondary | ICD-10-CM | POA: Diagnosis not present

## 2019-01-03 DIAGNOSIS — F1010 Alcohol abuse, uncomplicated: Secondary | ICD-10-CM | POA: Diagnosis not present

## 2019-01-03 DIAGNOSIS — J690 Pneumonitis due to inhalation of food and vomit: Secondary | ICD-10-CM | POA: Diagnosis not present

## 2019-01-03 DIAGNOSIS — E871 Hypo-osmolality and hyponatremia: Secondary | ICD-10-CM | POA: Diagnosis not present

## 2019-01-03 DIAGNOSIS — J9621 Acute and chronic respiratory failure with hypoxia: Secondary | ICD-10-CM | POA: Diagnosis not present

## 2019-02-12 ENCOUNTER — Encounter (HOSPITAL_COMMUNITY)
Admission: RE | Disposition: A | Discharge status: Home Health | Payer: Self-pay | Source: Other Acute Inpatient Hospital | Attending: Internal Medicine

## 2019-02-12 ENCOUNTER — Inpatient Hospital Stay: Admit: 2019-02-12 | Payer: Medicare Other | Admitting: Internal Medicine

## 2019-02-12 ENCOUNTER — Inpatient Hospital Stay: Admit: 2019-02-12 | Payer: Medicare Other | Admitting: Vascular Surgery

## 2019-02-12 ENCOUNTER — Inpatient Hospital Stay (HOSPITAL_COMMUNITY): Payer: Medicare Other | Admitting: Certified Registered Nurse Anesthetist

## 2019-02-12 ENCOUNTER — Inpatient Hospital Stay (HOSPITAL_COMMUNITY)
Admission: RE | Admit: 2019-02-12 | Discharge: 2019-02-18 | DRG: 908 | Disposition: A | Discharge status: Home Health | Payer: Medicare Other | Source: Other Acute Inpatient Hospital | Attending: Internal Medicine | Admitting: Internal Medicine

## 2019-02-12 DIAGNOSIS — R55 Syncope and collapse: Secondary | ICD-10-CM | POA: Diagnosis present

## 2019-02-12 DIAGNOSIS — Z751 Person awaiting admission to adequate facility elsewhere: Secondary | ICD-10-CM | POA: Diagnosis not present

## 2019-02-12 DIAGNOSIS — J449 Chronic obstructive pulmonary disease, unspecified: Secondary | ICD-10-CM | POA: Diagnosis present

## 2019-02-12 DIAGNOSIS — Z9181 History of falling: Secondary | ICD-10-CM

## 2019-02-12 DIAGNOSIS — I10 Essential (primary) hypertension: Secondary | ICD-10-CM | POA: Diagnosis not present

## 2019-02-12 DIAGNOSIS — Z79899 Other long term (current) drug therapy: Secondary | ICD-10-CM | POA: Diagnosis not present

## 2019-02-12 DIAGNOSIS — I2510 Atherosclerotic heart disease of native coronary artery without angina pectoris: Secondary | ICD-10-CM | POA: Diagnosis present

## 2019-02-12 DIAGNOSIS — Y838 Other surgical procedures as the cause of abnormal reaction of the patient, or of later complication, without mention of misadventure at the time of the procedure: Secondary | ICD-10-CM | POA: Diagnosis present

## 2019-02-12 DIAGNOSIS — S15091A Other specified injury of right carotid artery, initial encounter: Secondary | ICD-10-CM | POA: Diagnosis not present

## 2019-02-12 DIAGNOSIS — S15001A Unspecified injury of right carotid artery, initial encounter: Secondary | ICD-10-CM | POA: Diagnosis not present

## 2019-02-12 DIAGNOSIS — J9611 Chronic respiratory failure with hypoxia: Secondary | ICD-10-CM | POA: Diagnosis present

## 2019-02-12 DIAGNOSIS — W19XXXA Unspecified fall, initial encounter: Secondary | ICD-10-CM | POA: Diagnosis present

## 2019-02-12 DIAGNOSIS — Z7901 Long term (current) use of anticoagulants: Secondary | ICD-10-CM

## 2019-02-12 DIAGNOSIS — Y92239 Unspecified place in hospital as the place of occurrence of the external cause: Secondary | ICD-10-CM | POA: Diagnosis present

## 2019-02-12 DIAGNOSIS — F1010 Alcohol abuse, uncomplicated: Secondary | ICD-10-CM | POA: Diagnosis present

## 2019-02-12 DIAGNOSIS — F419 Anxiety disorder, unspecified: Secondary | ICD-10-CM | POA: Diagnosis present

## 2019-02-12 DIAGNOSIS — I5030 Unspecified diastolic (congestive) heart failure: Secondary | ICD-10-CM | POA: Diagnosis present

## 2019-02-12 DIAGNOSIS — I9751 Accidental puncture and laceration of a circulatory system organ or structure during a circulatory system procedure: Secondary | ICD-10-CM | POA: Diagnosis present

## 2019-02-12 DIAGNOSIS — E785 Hyperlipidemia, unspecified: Secondary | ICD-10-CM | POA: Diagnosis present

## 2019-02-12 DIAGNOSIS — F17210 Nicotine dependence, cigarettes, uncomplicated: Secondary | ICD-10-CM | POA: Diagnosis present

## 2019-02-12 DIAGNOSIS — F329 Major depressive disorder, single episode, unspecified: Secondary | ICD-10-CM | POA: Diagnosis present

## 2019-02-12 DIAGNOSIS — I509 Heart failure, unspecified: Secondary | ICD-10-CM | POA: Diagnosis not present

## 2019-02-12 DIAGNOSIS — H547 Unspecified visual loss: Secondary | ICD-10-CM | POA: Diagnosis present

## 2019-02-12 DIAGNOSIS — E876 Hypokalemia: Secondary | ICD-10-CM | POA: Diagnosis not present

## 2019-02-12 DIAGNOSIS — I480 Paroxysmal atrial fibrillation: Secondary | ICD-10-CM | POA: Diagnosis present

## 2019-02-12 DIAGNOSIS — I110 Hypertensive heart disease with heart failure: Secondary | ICD-10-CM | POA: Diagnosis present

## 2019-02-12 DIAGNOSIS — S15391A Other specified injury of right internal jugular vein, initial encounter: Secondary | ICD-10-CM

## 2019-02-12 DIAGNOSIS — S15009A Unspecified injury of unspecified carotid artery, initial encounter: Secondary | ICD-10-CM | POA: Diagnosis present

## 2019-02-12 DIAGNOSIS — F1011 Alcohol abuse, in remission: Secondary | ICD-10-CM | POA: Diagnosis present

## 2019-02-12 DIAGNOSIS — I4891 Unspecified atrial fibrillation: Secondary | ICD-10-CM

## 2019-02-12 DIAGNOSIS — Z1159 Encounter for screening for other viral diseases: Secondary | ICD-10-CM

## 2019-02-12 DIAGNOSIS — R1312 Dysphagia, oropharyngeal phase: Secondary | ICD-10-CM | POA: Diagnosis present

## 2019-02-12 DIAGNOSIS — R42 Dizziness and giddiness: Secondary | ICD-10-CM | POA: Diagnosis not present

## 2019-02-12 DIAGNOSIS — R296 Repeated falls: Secondary | ICD-10-CM | POA: Diagnosis present

## 2019-02-12 HISTORY — PX: ENDARTERECTOMY: SHX5162

## 2019-02-12 HISTORY — PX: VEIN REPAIR: SHX6524

## 2019-02-12 LAB — BASIC METABOLIC PANEL
Anion gap: 17 — ABNORMAL HIGH (ref 5–15)
BUN: 6 mg/dL — ABNORMAL LOW (ref 8–23)
CO2: 22 mmol/L (ref 22–32)
Calcium: 8.6 mg/dL — ABNORMAL LOW (ref 8.9–10.3)
Chloride: 90 mmol/L — ABNORMAL LOW (ref 98–111)
Creatinine, Ser: 0.83 mg/dL (ref 0.61–1.24)
GFR calc Af Amer: >60 mL/min (ref >60)
GFR calc non Af Amer: >60 mL/min (ref >60)
Glucose, Bld: 87 mg/dL (ref 70–99)
Potassium: 6.4 mmol/L (ref 3.5–5.1)
Sodium: 129 mmol/L — ABNORMAL LOW (ref 135–145)

## 2019-02-12 LAB — CBC
HCT: 41.1 % (ref 39.0–52.0)
Hemoglobin: 14.6 g/dL (ref 13.0–17.0)
MCH: 30.6 pg (ref 26.0–34.0)
MCHC: 35.5 g/dL (ref 30.0–36.0)
MCV: 86.2 fL (ref 80.0–100.0)
Platelets: 233 10*3/uL (ref 150–400)
RBC: 4.77 MIL/uL (ref 4.22–5.81)
RDW: 14.8 % (ref 11.5–15.5)
WBC: 7.3 10*3/uL (ref 4.0–10.5)
nRBC: 0 % (ref 0.0–0.2)

## 2019-02-12 LAB — TYPE AND SCREEN
ABO/RH(D): O POS
Antibody Screen: NEGATIVE

## 2019-02-12 LAB — GLUCOSE, CAPILLARY: Glucose-Capillary: 102 mg/dL — ABNORMAL HIGH (ref 70–99)

## 2019-02-12 LAB — ABO/RH: ABO/RH(D): O POS

## 2019-02-12 SURGERY — ENDARTERECTOMY, CAROTID
Anesthesia: General | Laterality: Right

## 2019-02-12 SURGERY — ENDARTERECTOMY, CAROTID
Anesthesia: General | Site: Neck | Laterality: Right

## 2019-02-12 MED ORDER — INSULIN ASPART 100 UNIT/ML IV SOLN
10.0000 [IU] | Freq: Once | INTRAVENOUS | Status: AC
  Administered 2019-02-12: 10 [IU] via INTRAVENOUS

## 2019-02-12 MED ORDER — PHENYLEPHRINE 40 MCG/ML (10ML) SYRINGE FOR IV PUSH (FOR BLOOD PRESSURE SUPPORT)
PREFILLED_SYRINGE | INTRAVENOUS | Status: DC | PRN
  Administered 2019-02-12 (×4): 120 ug via INTRAVENOUS

## 2019-02-12 MED ORDER — ACETAMINOPHEN 650 MG RE SUPP: 650.0000 mg | Freq: Four times a day (QID) | RECTAL | Status: DC | PRN

## 2019-02-12 MED ORDER — METOPROLOL TARTRATE 25 MG PO TABS
25.0000 mg | ORAL_TABLET | Freq: Two times a day (BID) | ORAL | Status: DC
  Administered 2019-02-13 – 2019-02-18 (×11): 25 mg via ORAL
  Filled 2019-02-12 (×11): qty 1

## 2019-02-12 MED ORDER — IPRATROPIUM-ALBUTEROL 20-100 MCG/ACT IN AERS
1.0000 | INHALATION_SPRAY | RESPIRATORY_TRACT | Status: DC | PRN
  Filled 2019-02-12: qty 4

## 2019-02-12 MED ORDER — ALBUTEROL SULFATE HFA 108 (90 BASE) MCG/ACT IN AERS
1.0000 | INHALATION_SPRAY | Freq: Four times a day (QID) | RESPIRATORY_TRACT | Status: DC | PRN
  Administered 2019-02-13 – 2019-02-16 (×2): 1 via RESPIRATORY_TRACT
  Filled 2019-02-12: qty 6.7

## 2019-02-12 MED ORDER — CEFAZOLIN SODIUM-DEXTROSE 2-3 GM-%(50ML) IV SOLR
INTRAVENOUS | Status: DC | PRN
  Administered 2019-02-12: 2 g via INTRAVENOUS

## 2019-02-12 MED ORDER — ALBUTEROL SULFATE (2.5 MG/3ML) 0.083% IN NEBU: 2.5000 mg | INHALATION_SOLUTION | Freq: Four times a day (QID) | RESPIRATORY_TRACT | Status: DC | PRN

## 2019-02-12 MED ORDER — LORAZEPAM 1 MG PO TABS
1.0000 mg | ORAL_TABLET | Freq: Four times a day (QID) | ORAL | Status: AC | PRN
  Administered 2019-02-13 – 2019-02-15 (×3): 1 mg via ORAL
  Filled 2019-02-12 (×3): qty 1

## 2019-02-12 MED ORDER — THIAMINE HCL 100 MG/ML IJ SOLN
100.0000 mg | Freq: Every day | INTRAMUSCULAR | Status: DC
  Administered 2019-02-12 – 2019-02-16 (×3): 100 mg via INTRAVENOUS
  Filled 2019-02-12 (×4): qty 2

## 2019-02-12 MED ORDER — ONDANSETRON HCL 4 MG/2ML IJ SOLN
4.0000 mg | Freq: Four times a day (QID) | INTRAMUSCULAR | Status: DC | PRN
  Administered 2019-02-14 – 2019-02-16 (×3): 4 mg via INTRAVENOUS
  Filled 2019-02-12 (×3): qty 2

## 2019-02-12 MED ORDER — ONDANSETRON HCL 4 MG/2ML IJ SOLN
INTRAMUSCULAR | Status: DC | PRN
  Administered 2019-02-12: 4 mg via INTRAVENOUS

## 2019-02-12 MED ORDER — PHENOL 1.4 % MT LIQD: 1.0000 | OROMUCOSAL | Status: DC | PRN

## 2019-02-12 MED ORDER — ROFLUMILAST 500 MCG PO TABS
500.0000 ug | ORAL_TABLET | Freq: Every day | ORAL | Status: DC
  Administered 2019-02-13 – 2019-02-18 (×7): 500 ug via ORAL
  Filled 2019-02-12 (×7): qty 1

## 2019-02-12 MED ORDER — HYDRALAZINE HCL 20 MG/ML IJ SOLN
10.0000 mg | Freq: Once | INTRAMUSCULAR | Status: AC
  Administered 2019-02-12: 23:00:00 10 mg via INTRAVENOUS
  Filled 2019-02-12: qty 1

## 2019-02-12 MED ORDER — CALCIUM GLUCONATE-NACL 1-0.675 GM/50ML-% IV SOLN
1.0000 g | Freq: Once | INTRAVENOUS | Status: AC
  Administered 2019-02-12: 1000 mg via INTRAVENOUS
  Filled 2019-02-12: qty 50

## 2019-02-12 MED ORDER — LORAZEPAM 2 MG/ML IJ SOLN
0.0000 mg | Freq: Two times a day (BID) | INTRAMUSCULAR | Status: AC
  Filled 2019-02-12: qty 1

## 2019-02-12 MED ORDER — MOMETASONE FURO-FORMOTEROL FUM 200-5 MCG/ACT IN AERO
2.0000 | INHALATION_SPRAY | Freq: Two times a day (BID) | RESPIRATORY_TRACT | Status: DC
  Administered 2019-02-13 – 2019-02-18 (×11): 2 via RESPIRATORY_TRACT
  Filled 2019-02-12: qty 8.8

## 2019-02-12 MED ORDER — ALBUTEROL SULFATE HFA 108 (90 BASE) MCG/ACT IN AERS
INHALATION_SPRAY | RESPIRATORY_TRACT | Status: DC | PRN
  Administered 2019-02-12: 6 via RESPIRATORY_TRACT

## 2019-02-12 MED ORDER — ADULT MULTIVITAMIN W/MINERALS CH
1.0000 | ORAL_TABLET | Freq: Every day | ORAL | Status: DC
  Administered 2019-02-13 – 2019-02-18 (×6): 1 via ORAL
  Filled 2019-02-12 (×6): qty 1

## 2019-02-12 MED ORDER — PROPOFOL 10 MG/ML IV BOLUS
INTRAVENOUS | Status: DC | PRN
  Administered 2019-02-12: 100 mg via INTRAVENOUS
  Administered 2019-02-12: 20 mg via INTRAVENOUS

## 2019-02-12 MED ORDER — ASPIRIN EC 81 MG PO TBEC
81.0000 mg | DELAYED_RELEASE_TABLET | Freq: Every day | ORAL | Status: DC
  Administered 2019-02-13 – 2019-02-14 (×2): 81 mg via ORAL
  Filled 2019-02-12 (×2): qty 1

## 2019-02-12 MED ORDER — FENTANYL CITRATE (PF) 250 MCG/5ML IJ SOLN
INTRAMUSCULAR | Status: DC | PRN
  Administered 2019-02-12: 25 ug via INTRAVENOUS
  Administered 2019-02-12: 50 ug via INTRAVENOUS

## 2019-02-12 MED ORDER — SODIUM CHLORIDE 0.9 % IV SOLN
INTRAVENOUS | Status: DC | PRN
  Administered 2019-02-12: 500 mL

## 2019-02-12 MED ORDER — MIRTAZAPINE 15 MG PO TABS
15.0000 mg | ORAL_TABLET | Freq: Every day | ORAL | Status: DC
  Administered 2019-02-13: 15 mg via ORAL
  Filled 2019-02-12: qty 1

## 2019-02-12 MED ORDER — IPRATROPIUM-ALBUTEROL 0.5-2.5 (3) MG/3ML IN SOLN: 3.0000 mL | RESPIRATORY_TRACT | Status: DC | PRN

## 2019-02-12 MED ORDER — SUGAMMADEX SODIUM 200 MG/2ML IV SOLN
INTRAVENOUS | Status: DC | PRN
  Administered 2019-02-12: 200 mg via INTRAVENOUS

## 2019-02-12 MED ORDER — VITAMIN B-1 100 MG PO TABS
100.0000 mg | ORAL_TABLET | Freq: Every day | ORAL | Status: DC
  Administered 2019-02-13 – 2019-02-18 (×4): 100 mg via ORAL
  Filled 2019-02-12 (×4): qty 1

## 2019-02-12 MED ORDER — CEFAZOLIN SODIUM-DEXTROSE 2-4 GM/100ML-% IV SOLN
2.0000 g | Freq: Three times a day (TID) | INTRAVENOUS | Status: AC
  Administered 2019-02-13 (×2): 2 g via INTRAVENOUS
  Filled 2019-02-12 (×2): qty 100

## 2019-02-12 MED ORDER — SUCCINYLCHOLINE CHLORIDE 200 MG/10ML IV SOSY
PREFILLED_SYRINGE | INTRAVENOUS | Status: DC | PRN
  Administered 2019-02-12: 120 mg via INTRAVENOUS

## 2019-02-12 MED ORDER — FLUOXETINE HCL 20 MG PO CAPS
20.0000 mg | ORAL_CAPSULE | Freq: Every day | ORAL | Status: DC
  Administered 2019-02-13 – 2019-02-18 (×6): 20 mg via ORAL
  Filled 2019-02-12 (×6): qty 1

## 2019-02-12 MED ORDER — SACUBITRIL-VALSARTAN 24-26 MG PO TABS: 1.0000 | ORAL_TABLET | Freq: Every day | ORAL | Status: DC

## 2019-02-12 MED ORDER — PANTOPRAZOLE SODIUM 40 MG PO TBEC
40.0000 mg | DELAYED_RELEASE_TABLET | Freq: Every day | ORAL | Status: DC
  Administered 2019-02-13 – 2019-02-18 (×6): 40 mg via ORAL
  Filled 2019-02-12 (×6): qty 1

## 2019-02-12 MED ORDER — OXYCODONE-ACETAMINOPHEN 5-325 MG PO TABS
1.0000 | ORAL_TABLET | ORAL | Status: DC | PRN
  Administered 2019-02-17 – 2019-02-18 (×2): 2 via ORAL
  Filled 2019-02-12 (×2): qty 2

## 2019-02-12 MED ORDER — SODIUM CHLORIDE 0.9 % IV SOLN
INTRAVENOUS | Status: DC
  Administered 2019-02-12 – 2019-02-13 (×2): via INTRAVENOUS

## 2019-02-12 MED ORDER — LACTATED RINGERS IV SOLN
INTRAVENOUS | Status: DC | PRN
  Administered 2019-02-12 (×2): via INTRAVENOUS

## 2019-02-12 MED ORDER — LORAZEPAM 2 MG/ML IJ SOLN: 1.0000 mg | Freq: Four times a day (QID) | INTRAMUSCULAR | Status: AC | PRN

## 2019-02-12 MED ORDER — DONEPEZIL HCL 5 MG PO TABS
5.0000 mg | ORAL_TABLET | Freq: Every day | ORAL | Status: DC
  Administered 2019-02-13 – 2019-02-18 (×6): 5 mg via ORAL
  Filled 2019-02-12 (×6): qty 1

## 2019-02-12 MED ORDER — ALPRAZOLAM 0.5 MG PO TABS
1.0000 mg | ORAL_TABLET | Freq: Two times a day (BID) | ORAL | Status: DC | PRN
  Administered 2019-02-13 – 2019-02-18 (×9): 1 mg via ORAL
  Filled 2019-02-12 (×10): qty 2

## 2019-02-12 MED ORDER — DEXAMETHASONE SODIUM PHOSPHATE 10 MG/ML IJ SOLN
INTRAMUSCULAR | Status: DC | PRN
  Administered 2019-02-12: 10 mg via INTRAVENOUS

## 2019-02-12 MED ORDER — MORPHINE SULFATE (PF) 2 MG/ML IV SOLN: 2.0000 mg | INTRAVENOUS | Status: DC | PRN

## 2019-02-12 MED ORDER — ROCURONIUM BROMIDE 10 MG/ML (PF) SYRINGE
PREFILLED_SYRINGE | INTRAVENOUS | Status: DC | PRN
  Administered 2019-02-12: 30 mg via INTRAVENOUS

## 2019-02-12 MED ORDER — LIDOCAINE 2% (20 MG/ML) 5 ML SYRINGE
INTRAMUSCULAR | Status: DC | PRN
  Administered 2019-02-12: 60 mg via INTRAVENOUS

## 2019-02-12 MED ORDER — ONDANSETRON HCL 4 MG PO TABS: 4.0000 mg | ORAL_TABLET | Freq: Four times a day (QID) | ORAL | Status: DC | PRN

## 2019-02-12 MED ORDER — DEXTROSE 50 % IV SOLN
1.0000 | Freq: Once | INTRAVENOUS | Status: AC
  Administered 2019-02-12: 50 mL via INTRAVENOUS
  Filled 2019-02-12: qty 50

## 2019-02-12 MED ORDER — DOCUSATE SODIUM 100 MG PO CAPS
100.0000 mg | ORAL_CAPSULE | Freq: Every day | ORAL | Status: DC
  Administered 2019-02-13: 100 mg via ORAL
  Filled 2019-02-12: qty 1

## 2019-02-12 MED ORDER — 0.9 % SODIUM CHLORIDE (POUR BTL) OPTIME
TOPICAL | Status: DC | PRN
  Administered 2019-02-12: 2000 mL

## 2019-02-12 MED ORDER — TAMSULOSIN HCL 0.4 MG PO CAPS
0.4000 mg | ORAL_CAPSULE | Freq: Every day | ORAL | Status: DC
  Administered 2019-02-13 – 2019-02-18 (×6): 0.4 mg via ORAL
  Filled 2019-02-12 (×6): qty 1

## 2019-02-12 MED ORDER — ONDANSETRON HCL 4 MG/2ML IJ SOLN: 4.0000 mg | Freq: Four times a day (QID) | INTRAMUSCULAR | Status: DC | PRN

## 2019-02-12 MED ORDER — ACETAMINOPHEN 325 MG PO TABS: 650.0000 mg | ORAL_TABLET | Freq: Four times a day (QID) | ORAL | Status: DC | PRN

## 2019-02-12 MED ORDER — PREDNISONE 20 MG PO TABS
40.0000 mg | ORAL_TABLET | Freq: Every day | ORAL | Status: DC
  Administered 2019-02-13: 40 mg via ORAL
  Filled 2019-02-12: qty 2

## 2019-02-12 MED ORDER — HYDRALAZINE HCL 20 MG/ML IJ SOLN: 10.0000 mg | INTRAMUSCULAR | Status: DC | PRN

## 2019-02-12 MED ORDER — FOLIC ACID 1 MG PO TABS
1.0000 mg | ORAL_TABLET | Freq: Every day | ORAL | Status: DC
  Administered 2019-02-13 – 2019-02-18 (×6): 1 mg via ORAL
  Filled 2019-02-12 (×6): qty 1

## 2019-02-12 MED ORDER — LORAZEPAM 2 MG/ML IJ SOLN
0.0000 mg | Freq: Four times a day (QID) | INTRAMUSCULAR | Status: AC
  Administered 2019-02-12: 2 mg via INTRAVENOUS
  Filled 2019-02-12: qty 1

## 2019-02-12 MED ORDER — PRAVASTATIN SODIUM 40 MG PO TABS
40.0000 mg | ORAL_TABLET | Freq: Every day | ORAL | Status: DC
  Administered 2019-02-13 – 2019-02-18 (×6): 40 mg via ORAL
  Filled 2019-02-12 (×6): qty 1

## 2019-02-12 MED ORDER — SODIUM CHLORIDE 0.9% FLUSH
3.0000 mL | Freq: Two times a day (BID) | INTRAVENOUS | Status: DC
  Administered 2019-02-12 – 2019-02-17 (×8): 3 mL via INTRAVENOUS

## 2019-02-12 MED ORDER — MONTELUKAST SODIUM 10 MG PO TABS
10.0000 mg | ORAL_TABLET | Freq: Every day | ORAL | Status: DC
  Administered 2019-02-13 – 2019-02-18 (×5): 10 mg via ORAL
  Filled 2019-02-12 (×5): qty 1

## 2019-02-12 SURGICAL SUPPLY — 39 items
CANISTER SUCT 3000ML PPV (MISCELLANEOUS) ×4 IMPLANT
CANNULA VESSEL 3MM 2 BLNT TIP (CANNULA) ×8 IMPLANT
CATH ROBINSON RED A/P 18FR (CATHETERS) ×8 IMPLANT
CLIP LIGATING EXTRA MED SLVR (CLIP) ×4 IMPLANT
CLIP LIGATING EXTRA SM BLUE (MISCELLANEOUS) ×4 IMPLANT
COVER WAND RF STERILE (DRAPES) ×4 IMPLANT
DECANTER SPIKE VIAL GLASS SM (MISCELLANEOUS) IMPLANT
DERMABOND ADVANCED (GAUZE/BANDAGES/DRESSINGS) ×2
DERMABOND ADVANCED .7 DNX12 (GAUZE/BANDAGES/DRESSINGS) ×2 IMPLANT
DRAIN HEMOVAC 1/8 X 5 (WOUND CARE) IMPLANT
ELECT REM PT RETURN 9FT ADLT (ELECTROSURGICAL) ×4
ELECTRODE REM PT RTRN 9FT ADLT (ELECTROSURGICAL) ×2 IMPLANT
EVACUATOR SILICONE 100CC (DRAIN) IMPLANT
GLOVE SS BIOGEL STRL SZ 7.5 (GLOVE) ×2 IMPLANT
GLOVE SUPERSENSE BIOGEL SZ 7.5 (GLOVE) ×2
GOWN STRL REUS W/ TWL LRG LVL3 (GOWN DISPOSABLE) ×6 IMPLANT
GOWN STRL REUS W/TWL LRG LVL3 (GOWN DISPOSABLE) ×6
KIT BASIN OR (CUSTOM PROCEDURE TRAY) ×4 IMPLANT
KIT SHUNT ARGYLE CAROTID ART 6 (VASCULAR PRODUCTS) IMPLANT
KIT TURNOVER KIT B (KITS) ×4 IMPLANT
NEEDLE 22X1 1/2 (OR ONLY) (NEEDLE) IMPLANT
NS IRRIG 1000ML POUR BTL (IV SOLUTION) ×8 IMPLANT
PACK CAROTID (CUSTOM PROCEDURE TRAY) ×4 IMPLANT
PAD ARMBOARD 7.5X6 YLW CONV (MISCELLANEOUS) ×8 IMPLANT
POSITIONER HEAD DONUT 9IN (MISCELLANEOUS) ×4 IMPLANT
SHUNT CAROTID BYPASS 10 (VASCULAR PRODUCTS) IMPLANT
SHUNT CAROTID BYPASS 12FRX15.5 (VASCULAR PRODUCTS) IMPLANT
SUT ETHILON 3 0 PS 1 (SUTURE) IMPLANT
SUT PROLENE 5 0 C 1 24 (SUTURE) ×12 IMPLANT
SUT PROLENE 6 0 CC (SUTURE) ×4 IMPLANT
SUT SILK 3 0 (SUTURE)
SUT SILK 3-0 18XBRD TIE 12 (SUTURE) IMPLANT
SUT VIC AB 3-0 SH 27 (SUTURE) ×4
SUT VIC AB 3-0 SH 27X BRD (SUTURE) ×4 IMPLANT
SUT VIC AB 4-0 PS2 27 (SUTURE) ×4 IMPLANT
SUT VICRYL 4-0 PS2 18IN ABS (SUTURE) ×4 IMPLANT
SYR CONTROL 10ML LL (SYRINGE) IMPLANT
TOWEL GREEN STERILE (TOWEL DISPOSABLE) ×4 IMPLANT
WATER STERILE IRR 1000ML POUR (IV SOLUTION) ×4 IMPLANT

## 2019-02-12 NOTE — H&P (Signed)
History and Physical    Marc Morris GNF:[id number] DOB: 1941-04-15 DOA: 02/12/2019  Referring MD/NP/PA: Flora Lipps, MD PCP: Raelyn Number, MD  Patient coming from: Transfer from [facility]  Chief Complaint:Fall  I have personally briefly reviewed patient's old medical records in [place]   HPI: Marc Morris is a 78 y.o. male with medical history significant of atrial fibrillation, HTN, COPD, HLD, CAD, alcohol abuse, and seizures; who presented with complaints of dizziness and fall.  Patient was in the process of being admitted for syncope and what sounds like failure to thrive work-up.  In the emergency department at [facility] he had received a central line for difficulty with IV access, but accidentally had carotid arterial cannulation with a 7 French catheter catheter.  Chest x-ray is noted abnormal course of the right central catheter concerning for arterial placement.  Vital signs otherwise were noted to be within normal limits.  CT scan of the brain and cervical spine showed no acute abnormalities and diffuse cortical atrophy with multilevel degenerative disc disease.  X-rays of the patient's pelvis showed no acute fractures.  Dr. Donnetta Hutching of vascular surgery was consulted and recommended emergent transfer for surgical intervention.  On admission to [facility] patient was sent immediately to surgery and underwent removal of central line and repair of the internal jugular vein and common carotid artery.  Patient unable to give much history given he is post anesthesia.   ED Course: As seen above  Review of Systems  Unable to perform ROS: Medical condition    Past Medical History:  Diagnosis Date  . COPD (chronic obstructive pulmonary disease)   . Headache   . Hematuria - cause not known 2012   scoped by urologist in [place], did not find a cause.  Marland Kitchen Hypertension   . Pneumonia 2012   hospitalized at [facility]  . Seizures    age 33- was  dinking alot of alchol  . Shortness of breath    after standing, then it "gets all right"  . Vision loss    "needs glasses"    Past Surgical History:  Procedure Laterality Date  . CYSTOSCOPY  2012     reports that he has been smoking. He has a 3.70 pack-year smoking history. He does not have any smokeless tobacco history on file. He reports that he does not drink alcohol or use drugs.  Not on File  Family History  Problem Relation Age of Onset  . Anesthesia problems Neg Hx     Prior to Admission medications   Medication Sig Start Date End Date Taking? Authorizing Provider  ALPRAZolam Duanne Moron) 1 MG tablet Take 1 mg by mouth 2 (two) times daily as needed. For anxiety    [provider]  lisinopril (PRINIVIL,ZESTRIL) 10 MG tablet Take 5-10 mg by mouth 2 (two) times daily as needed. May take a half tab if blood pressure is too low    [provider]  metoprolol (LOPRESSOR) 50 MG tablet Take 25 mg by mouth 2 (two) times daily.     [provider]  pravastatin (PRAVACHOL) 40 MG tablet Take 40 mg by mouth daily.    [provider]  Tamsulosin HCl (FLOMAX) 0.4 MG CAPS Take 0.4 mg by mouth daily.    [provider]    Physical Exam:  Constitutional: Elderly male who appears disheveled and unkept Vitals:   02/12/19 1724  BP: (!) 141/87  Pulse: 82  Resp: 16  Temp: (!)  97.5 F (36.4 C)  SpO2: 100%   Eyes: PERRL, lids and conjunctivae normal ENMT: Mucous membranes are dry posterior pharynx clear of any exudate or lesions. Poor dentition Neck: Right-sided neck incision site present with Dermabond Respiratory: Basilar Rales appreciated both lung fields. Cardiovascular: Regular rate and rhythm, no murmurs / rubs / gallops. No extremity edema. 2+ pedal pulses. No carotid bruits.  Left upper extremity IV line in place. Abdomen: no tenderness, no masses palpated. No hepatosplenomegaly. Bowel sounds positive.  Musculoskeletal: no clubbing /  cyanosis. No joint deformity upper and lower extremities. Good ROM, no contractures. Normal muscle tone.  Skin: Denudation of skin of the bilateral feet Neurologic: CN 2-12 grossly intact.  Patient able to move all extremities. Speech is slurred postanesthesia. Psychiatric: Patient lethargic, but arousable we will respond to verbal command.    Labs on Admission: I have personally reviewed following labs and imaging studies  CBC: No results for input(s): WBC, NEUTROABS, HGB, HCT, MCV, PLT in the last 168 hours. Basic Metabolic Panel: No results for input(s): NA, K, CL, CO2, GLUCOSE, BUN, CREATININE, CALCIUM, MG, PHOS in the last 168 hours. GFR: CrCl cannot be calculated (Patient's most recent lab result is older than the maximum 21 days allowed.). Liver Function Tests: No results for input(s): AST, ALT, ALKPHOS, BILITOT, PROT, ALBUMIN in the last 168 hours. No results for input(s): LIPASE, AMYLASE in the last 168 hours. No results for input(s): AMMONIA in the last 168 hours. Coagulation Profile: No results for input(s): INR, PROTIME in the last 168 hours. Cardiac Enzymes: No results for input(s): CKTOTAL, CKMB, CKMBINDEX, TROPONINI in the last 168 hours. BNP (last 3 results) No results for input(s): PROBNP in the last 8760 hours. HbA1C: No results for input(s): HGBA1C in the last 72 hours. CBG: Recent Labs  Lab 02/12/19 1551  GLUCAP 102*   Lipid Profile: No results for input(s): CHOL, HDL, LDLCALC, TRIG, CHOLHDL, LDLDIRECT in the last 72 hours. Thyroid Function Tests: No results for input(s): TSH, T4TOTAL, FREET4, T3FREE, THYROIDAB in the last 72 hours. Anemia Panel: No results for input(s): VITAMINB12, FOLATE, FERRITIN, TIBC, IRON, RETICCTPCT in the last 72 hours. Urine analysis: No results found for: COLORURINE, APPEARANCEUR, LABSPEC, PHURINE, GLUCOSEU, HGBUR, BILIRUBINUR, KETONESUR, PROTEINUR, UROBILINOGEN, NITRITE, LEUKOCYTESUR Sepsis Labs: No results found for this or  any previous visit (from the past 240 hour(s)).   Radiological Exams on Admission: No results found.  EKG:  Assessment/Plan Removal of central catheter of right common carotid: Acute.  Patient had accidental insertion of a central venous catheter into the right common carotid with injury to the internal jugular vein.  Dr. Arbie CookeyEarly was able to remove the catheter and repair right common carotid artery and internal jugular vein.  After completion of surgery patient appears to be able to move all extremities. -Admit to a progressive bed -Neurochecks -Aspiration precautions -Speech therapy consult for swallowing evaluation if needed in a.m. -Appreciate vascular surgery we will follow-up for any further recommendations  Near syncope, dizziness, fall: Patient initially presented to the hospital for near syncope, dizziness, and falls.  Reportedly frequently admitted at their hospital for symptoms dealing with alcohol abuse. -Normal saline at 75 mL/h overnight -Check echocardiogram in a.m -Physical therapy to eval and treat -Follow-up telemetry overnight  Paroxysmal atrial fibrillation: Patient reportedly supposed to be on Xarelto but not clear if patient's taking this medication.  He appears currently in sinus rhythm. -Xarelto initially held due to surgical intervention -Consider restarting anticoagulation when medically appropriate  Essential hypertension -We  will need to continue home medications once verified -Hydralazine IV as needed  COPD: Patient with rhonchorous breath sounds, but previously noted to have no significant signs of pneumonia on chest x-ray. -Incentive spirometry -DuoNebs as needed for shortness of breath/wheeze  History of alcohol abuse -CIWA protocols     DVT prophylaxis: SCD Code Status: Full Family Communication: No family present at bedside Disposition Plan: TBD Consults called: Vascular surgery  Admission status: Inpatient   Clydie Braunondell A Smith MD [facility] Pager [phone]   If 7PM-7AM, please contact night-coverage [email or web address] Password [email or web address]  02/12/2019, 4:07 PM

## 2019-02-12 NOTE — Consult Note (Signed)
Vascular and Vein Specialist of [place]  Patient name: Marc Morris MRN: [id number] DOB: 03-07-1941 Sex: male    HPI: Marc Morris is a 78 y.o. male the patient was transferred from [facility] to [facility] for evaluation.  He was admitted with dehydration.  Longstanding alcoholic with multiple prior admissions at [facility].  Had a central line placement today and chest x-ray suggested that the central line was probably in the carotid artery.  Apparently there was more than the usual expected venous bleeding from the catheter and due to the 7 [place] sheath, the physicians at [place] did not feel comfortable removing the catheter.  Patient was transferred to the hospitalist service at [facility] and I was consulted for removal.  The patient came via [facility] immediately to the holding area.  Past Medical History:  Diagnosis Date  . COPD (chronic obstructive pulmonary disease)   . Headache   . Hematuria - cause not known 2012   scoped by urologist in [place], did not find a cause.  Marland Kitchen Hypertension   . Pneumonia 2012   hospitalized at [facility]  . Seizures    age 23- was dinking alot of alchol  . Shortness of breath    after standing, then it "gets all right"  . Vision loss    "needs glasses"    Family History  Problem Relation Age of Onset  . Anesthesia problems Neg Hx     SOCIAL HISTORY: Social History   Tobacco Use  . Smoking status: Current Every Day Smoker    Packs/day: 0.10    Years: 37.00    Pack years: 3.70  Substance Use Topics  . Alcohol use: No    Comment: quick last year 'drank too much'    Not on File  No current facility-administered medications for this encounter.     REVIEW OF SYSTEMS:  [X]  denotes positive finding, [ ]  denotes negative finding Cardiac  Comments:  Chest pain or chest pressure:    Shortness of breath upon exertion:    Short of breath when lying flat:     Irregular heart rhythm:        Vascular    Pain in calf, thigh, or hip brought on by ambulation:    Pain in feet at night that wakes you up from your sleep:     Blood clot in your veins:    Leg swelling:           PHYSICAL EXAM: There were no vitals filed for this visit.  GENERAL: The patient is a well-nourished male, in no acute distress. The vital signs are documented above. CARDIOVASCULAR: Palpable radial pulses bilaterally.  The patient does have a triple-lumen central catheter in the right neck.  On aspirating this, this is clearly arterial.  The pressure from the catheter pushes the syringe. PULMONARY: There is good air exchange  MUSCULOSKELETAL: There are no major deformities or cyanosis. NEUROLOGIC: No focal weakness or paresthesias are detected. SKIN: There are no ulcers or rashes noted. PSYCHIATRIC: The patient has a normal affect.  DATA:  Chest x-ray shows no pneumothorax.  Does show that the catheter enters the right neck and crosses the mediastinum to what appears to be the descending thoracic aorta  MEDICAL ISSUES: Inadvertent placement of triple-lumen catheter into the right carotid artery.  Gust need for operative exploration and removal with the patient.  Will proceed urgently with surgery.  The patient is on chronic anticoagulation.  Larina Earthlyodd F. Geryl Dohn, MD FACS [facility] Office Tel [phone] Pager [phone]

## 2019-02-12 NOTE — Anesthesia Procedure Notes (Signed)
Procedure Name: Intubation Date/Time: 02/12/2019 4:23 PM Performed by: Jearld Pies, CRNA Pre-anesthesia Checklist: Patient identified, Emergency Drugs available, Suction available and Patient being monitored Patient Re-evaluated:Patient Re-evaluated prior to induction Oxygen Delivery Method: Circle System Utilized Preoxygenation: Pre-oxygenation with 100% oxygen Induction Type: IV induction, Rapid sequence and Cricoid Pressure applied Laryngoscope Size: Mac and 4 Grade View: Grade I Tube type: Oral Tube size: 7.5 mm Number of attempts: 1 Airway Equipment and Method: Stylet and Oral airway Placement Confirmation: ETT inserted through vocal cords under direct vision,  positive ETCO2 and breath sounds checked- equal and bilateral Secured at: 22 cm Tube secured with: Tape Dental Injury: Teeth and Oropharynx as per pre-operative assessment

## 2019-02-12 NOTE — Transfer of Care (Signed)
Immediate Anesthesia Transfer of Care Note  Patient: Marc Morris  Procedure(s) Performed: Right neck exploration and removal of carotid central line. (Right ) REPAIR RIGHT JUGULAR VEIN (Right Neck)  Patient Location: PACU  Anesthesia Type:General  Level of Consciousness: drowsy and responds to stimulation, within baseline mental status  Airway & Oxygen Therapy: Patient Spontanous Breathing and Patient connected to face mask oxygen  Post-op Assessment: Report given to RN and Post -op Vital signs reviewed and stable  Post vital signs: Reviewed and stable  Last Vitals:  Vitals Value Taken Time  BP 141/87 02/12/19 1724  Temp    Pulse 90 02/12/19 1727  Resp 15 02/12/19 1727  SpO2 100 % 02/12/19 1727  Vitals shown include unvalidated device data.  Last Pain: There were no vitals filed for this visit.       Complications: No apparent anesthesia complications

## 2019-02-12 NOTE — Progress Notes (Signed)
BP 156/107. PO meds held due to asp prec and patient being very drowsy. Also, critical Potassium of 6.4. Messaged K Schorr regarding above.

## 2019-02-12 NOTE — Progress Notes (Signed)
EKG complete.

## 2019-02-12 NOTE — Anesthesia Preprocedure Evaluation (Signed)
Anesthesia Evaluation  Patient identified by MRN, date of birth, ID band Patient awake    Reviewed: Allergy & Precautions, H&P , NPO status , Patient's Chart, lab work & pertinent test results  Airway Mallampati: II   Neck ROM: full    Dental   Pulmonary shortness of breath, COPD,    breath sounds clear to auscultation       Cardiovascular hypertension,  Rhythm:regular Rate:Normal     Neuro/Psych  Headaches, Seizures -,     GI/Hepatic   Endo/Other    Renal/GU      Musculoskeletal   Abdominal   Peds  Hematology   Anesthesia Other Findings   Reproductive/Obstetrics                             Anesthesia Physical Anesthesia Plan  ASA: II and emergent  Anesthesia Plan: General   Post-op Pain Management:    Induction: Intravenous  PONV Risk Score and Plan: 2 and Ondansetron, Dexamethasone and Treatment may vary due to age or medical condition  Airway Management Planned: Oral ETT  Additional Equipment: Arterial line  Intra-op Plan:   Post-operative Plan: Extubation in OR  Informed Consent: I have reviewed the patients History and Physical, chart, labs and discussed the procedure including the risks, benefits and alternatives for the proposed anesthesia with the patient or authorized representative who has indicated his/her understanding and acceptance.       Plan Discussed with: CRNA, Anesthesiologist and Surgeon  Anesthesia Plan Comments:         Anesthesia Quick Evaluation

## 2019-02-12 NOTE — Op Note (Signed)
    OPERATIVE REPORT  DATE OF SURGERY: 02/12/2019  PATIENT: Marc Morris, 78 y.o. male MRN: [id number]  DOB: 07/16/1941  PRE-OPERATIVE DIAGNOSIS: Unintended placement of right central line into right common carotid artery  POST-OPERATIVE DIAGNOSIS:  Same with through and through penetration through the internal jugular vein  PROCEDURE: Removal of central line and repair of internal jugular vein and common carotid artery  SURGEON:  Curt Jews, M.D.  PHYSICIAN ASSISTANT: Liana Crocker, PA-C  ANESTHESIA: General  EBL: per anesthesia record  Total I/O In: -  Out: 25 [Blood:25]  BLOOD ADMINISTERED: none  DRAINS: none  SPECIMEN: none  COUNTS CORRECT:  YES  PATIENT DISPOSITION:  PACU - hemodynamically stable  PROCEDURE DETAILS: Patient was admitted to [facility] earlier and had central line placed for volume resuscitation.  Postop chest x-ray revealed that this in all likelihood was in the common carotid artery and the patient was transferred to [facility] for treatment.  On arrival I aspirated the catheter and is clearly was arterial.  I reviewed the chest x-ray which also showed that this cross the midline to the level of the descending thoracic aorta.  The patient was taken to the operating room and the area of the right neck and chest prepped draped in usual sterile fashion.  An incision was made over the catheter and carried down through the subcutaneous fat.  The sternocleidomastoid was mobilized and the omohyoid was divided.  The catheter passed through and through the jugular vein and then into the common carotid artery at the base of the neck.  The catheter was occluded with a hemostat near the entry into the common carotid artery and was divided.  This was then brought out through the jugular vein and the jugular vein was occluded with baby Gregory clamp.  The entry and exit holes through the jugular vein were closed with 5-0 Prolene sutures.  The Gregory  clamp was removed.  Next a 5-0 Prolene suture was placed in a pursestring fashion at the entry site into the common carotid artery.  The catheter was removed and the black back bleeding was allowed.  The pursestring suture was secured and adequate hemostasis was obtained.  The patient had excellent pulse distal to the pursestring closure.  There was no evidence of narrowing of the carotid artery.  The wound was irrigated with saline.  Hemostasis electrocautery.  The wounds were closed with 3-0 Vicryl to reapproximate sternocleidomastoid over the carotid artery.  The platysma was closed with a running 3-0 Vicryl suture and the skin was closed with a 4-0 subcuticular Vicryl stitch.  The patient was awakened neurologically intact in the operating room was transferred to the recovery room in stable condition   Rosetta Posner, M.D., [facility] 02/12/2019 5:12 PM

## 2019-02-13 ENCOUNTER — Encounter (HOSPITAL_COMMUNITY): Payer: Self-pay | Admitting: Vascular Surgery

## 2019-02-13 ENCOUNTER — Inpatient Hospital Stay (HOSPITAL_COMMUNITY): Payer: Medicare Other

## 2019-02-13 DIAGNOSIS — R55 Syncope and collapse: Secondary | ICD-10-CM

## 2019-02-13 DIAGNOSIS — J9611 Chronic respiratory failure with hypoxia: Secondary | ICD-10-CM | POA: Diagnosis present

## 2019-02-13 LAB — CBC
HCT: 39.2 % (ref 39.0–52.0)
Hemoglobin: 13.9 g/dL (ref 13.0–17.0)
MCH: 30.6 pg (ref 26.0–34.0)
MCHC: 35.5 g/dL (ref 30.0–36.0)
MCV: 86.3 fL (ref 80.0–100.0)
Platelets: 244 10*3/uL (ref 150–400)
RBC: 4.54 MIL/uL (ref 4.22–5.81)
RDW: 14.6 % (ref 11.5–15.5)
WBC: 7.1 10*3/uL (ref 4.0–10.5)
nRBC: 0 % (ref 0.0–0.2)

## 2019-02-13 LAB — ECHOCARDIOGRAM COMPLETE
Height: 66 in
Weight: 1827.17 oz

## 2019-02-13 LAB — BASIC METABOLIC PANEL
Anion gap: 17 — ABNORMAL HIGH (ref 5–15)
BUN: 8 mg/dL (ref 8–23)
CO2: 22 mmol/L (ref 22–32)
Calcium: 8.9 mg/dL (ref 8.9–10.3)
Chloride: 91 mmol/L — ABNORMAL LOW (ref 98–111)
Creatinine, Ser: 0.76 mg/dL (ref 0.61–1.24)
GFR calc Af Amer: >60 mL/min (ref >60)
GFR calc non Af Amer: >60 mL/min (ref >60)
Glucose, Bld: 86 mg/dL (ref 70–99)
Potassium: 5.1 mmol/L (ref 3.5–5.1)
Sodium: 130 mmol/L — ABNORMAL LOW (ref 135–145)

## 2019-02-13 LAB — SARS CORONAVIRUS 2 BY RT PCR (HOSPITAL ORDER, PERFORMED IN ~~LOC~~ HOSPITAL LAB): SARS Coronavirus 2: NEGATIVE

## 2019-02-13 MED ORDER — SENNOSIDES-DOCUSATE SODIUM 8.6-50 MG PO TABS
2.0000 | ORAL_TABLET | Freq: Every day | ORAL | Status: DC
  Administered 2019-02-13 – 2019-02-17 (×5): 2 via ORAL
  Filled 2019-02-13 (×5): qty 2

## 2019-02-13 MED ORDER — PREDNISONE 20 MG PO TABS
20.0000 mg | ORAL_TABLET | Freq: Every day | ORAL | Status: DC
  Administered 2019-02-14: 20 mg via ORAL
  Filled 2019-02-13: qty 1

## 2019-02-13 MED ORDER — DEXTROSE-NACL 5-0.9 % IV SOLN
INTRAVENOUS | Status: DC
  Administered 2019-02-13 – 2019-02-15 (×3): via INTRAVENOUS

## 2019-02-13 MED ORDER — RIVAROXABAN 20 MG PO TABS
20.0000 mg | ORAL_TABLET | Freq: Every day | ORAL | Status: DC
  Administered 2019-02-13 – 2019-02-18 (×6): 20 mg via ORAL
  Filled 2019-02-13 (×6): qty 1

## 2019-02-13 MED ORDER — MIRTAZAPINE 15 MG PO TABS
15.0000 mg | ORAL_TABLET | Freq: Every day | ORAL | Status: DC
  Administered 2019-02-13 – 2019-02-17 (×5): 15 mg via ORAL
  Filled 2019-02-13 (×5): qty 1

## 2019-02-13 NOTE — Evaluation (Signed)
Physical Therapy Evaluation Patient Details Name: Marc Morris MRN: [id number] DOB: April 29, 1941 Today's Date: 02/13/2019   History of Present Illness  Marc Morris is a 78 y.o. male with medical history significant of atrial fibrillation, HTN, COPD, HLD, CAD, alcohol abuse, and seizures; who presented with complaints of dizziness and fall.  Patient was in the process of being admitted for syncope and what sounds like failure to thrive work-up.  Clinical Impression  Pt admitted with/for dizziness/fall and failure to thrive at home with available level of care.  Pt needing min to supervision for most mobility.  Pt currently limited functionally due to the problems listed. ( See problems list.)   Pt will benefit from PT to maximize function and safety in order to get ready for next venue listed below.     Follow Up Recommendations SNF;Supervision/Assistance - 24 hour;Other (comment)(need more assist at home for safety with HHPT)    Equipment Recommendations  None recommended by PT(TBA further)    Recommendations for Other Services       Precautions / Restrictions Precautions Precautions: Fall      Mobility  Bed Mobility Overal bed mobility: Needs Assistance Bed Mobility: Supine to Sit;Sit to Supine     Supine to sit: Min guard Sit to supine: Min guard      Transfers Overall transfer level: Needs assistance   Transfers: Sit to/from Stand Sit to Stand: Supervision            Ambulation/Gait Ambulation/Gait assistance: Min guard;Min assist Gait Distance (Feet): 280 Feet Assistive device: Rolling walker (2 wheeled) Gait Pattern/deviations: Step-through pattern Gait velocity: slower   General Gait Details: mildly unsteady overall with drifting and tendency to stay too far behind the RW  Stairs            Wheelchair Mobility    Modified Rankin (Stroke Patients Only)       Balance Overall balance assessment: Needs assistance Sitting-balance  support: No upper extremity supported Sitting balance-Leahy Scale: Good     Standing balance support: Bilateral upper extremity supported;Single extremity supported Standing balance-Leahy Scale: Poor Standing balance comment: reliant on AD or external support                             Pertinent Vitals/Pain Pain Assessment: Faces Faces Pain Scale: No hurt Pain Intervention(s): Monitored during session    Home Living Family/patient expects to be discharged to:: Private residence Living Arrangements: Alone Available Help at Discharge: Family;Personal care attendant;Available PRN/intermittently Type of Home: House Home Access: Stairs to enter Entrance Stairs-Rails: Psychiatric nurse of Steps: 3 Home Layout: One level Home Equipment: Tub bench;Walker - 2 wheels;Cane - quad;Bedside commode      Prior Function Level of Independence: Needs assistance   Gait / Transfers Assistance Needed: uses cane or RW.  Pt is a poor historian and doesn't relate how well he does with either  ADL's / Homemaking Assistance Needed: has a PCA he calls his nephew that help with ADL's, meals and cleaning per Niece  (related by pt's nurse.)        Hand Dominance        Extremity/Trunk Assessment   Upper Extremity Assessment Upper Extremity Assessment: Generalized weakness(but functional)    Lower Extremity Assessment Lower Extremity Assessment: Generalized weakness(proximal muscle weakness, but functional)       Communication   Communication: Other (comment);HOH(mumbles )  Cognition Arousal/Alertness: Awake/alert Behavior During Therapy: WFL for tasks assessed/performed  Overall Cognitive Status: No family/caregiver present to determine baseline cognitive functioning                                 General Comments: pt is a poor historian and processes slowly      General Comments      Exercises     Assessment/Plan    PT Assessment  Patient needs continued PT services  PT Problem List Decreased strength;Decreased activity tolerance;Decreased balance;Decreased mobility;Decreased safety awareness;Decreased knowledge of use of DME       PT Treatment Interventions DME instruction;Gait training;Stair training;Functional mobility training;Therapeutic activities;Balance training;Patient/family education    PT Goals (Current goals can be found in the Care Plan section)  Acute Rehab PT Goals Patient Stated Goal: pt did not participate. PT Goal Formulation: Patient unable to participate in goal setting Time For Goal Achievement: 02/27/19 Potential to Achieve Goals: Fair    Frequency Min 2X/week   Barriers to discharge Other (comment)(appears based on nieces information that pt needs more assis)      Co-evaluation               AM-PAC PT "6 Clicks" Mobility  Outcome Measure Help needed turning from your back to your side while in a flat bed without using bedrails?: None Help needed moving from lying on your back to sitting on the side of a flat bed without using bedrails?: None Help needed moving to and from a bed to a chair (including a wheelchair)?: A Little Help needed standing up from a chair using your arms (e.g., wheelchair or bedside chair)?: A Little Help needed to walk in hospital room?: A Little Help needed climbing 3-5 steps with a railing? : A Little 6 Click Score: 20    End of Session Equipment Utilized During Treatment: Oxygen Activity Tolerance: Patient tolerated treatment well Patient left: in chair;with call bell/phone within reach;with chair alarm set Nurse Communication: Mobility status PT Visit Diagnosis: Unsteadiness on feet (R26.81);Adult, failure to thrive (R62.7)    Time: [id number] PT Time Calculation (min) (ACUTE ONLY): 24 min   Charges:   PT Evaluation $PT Eval Moderate Complexity: 1 Mod PT Treatments $Gait Training: 8-22 mins        02/13/2019  Mohnton BingKen Braelynn Benning, PT Acute  Rehabilitation Services [phone]  (pager) [phone]  (office)  Eliseo GumKenneth V Demar Shad 02/13/2019, 4:47 PM

## 2019-02-13 NOTE — Progress Notes (Signed)
Pt more alert and opens eyes spontaneously. Pt aware of person and place. Answering questions well although still delayed in responses. Pt moving all extremities without deformity and VS stable. Will continue to monitor. Lajoyce Corners, RN

## 2019-02-13 NOTE — Progress Notes (Signed)
  Echocardiogram 2D Echocardiogram has been performed.  Jannett Celestine 02/13/2019, 1:00 PM

## 2019-02-13 NOTE — Progress Notes (Signed)
Pt assessed with off-going RN. Pt still drowsy but will rouse to voice and is answering questions and following commands. Will continue to monitor. Lajoyce Corners, RN

## 2019-02-13 NOTE — Progress Notes (Signed)
02/12/2019 1815 Received pt from PACU.  Pt is alert to speech, still very sleepy from the surgery.  No C/O voiced.  Tele monitior applied and CCMD notified.  CHG bath given.  R neck incision WNL.  Neuro intact.  Oriented to room, call light and bed.  Call bell in reach. Carney Corners

## 2019-02-13 NOTE — Anesthesia Postprocedure Evaluation (Signed)
Anesthesia Post Note  Patient: Marc Morris  Procedure(s) Performed: Right neck exploration and removal of carotid central line. (Right ) REPAIR RIGHT JUGULAR VEIN (Right Neck)     Patient location during evaluation: PACU Anesthesia Type: General Level of consciousness: awake and alert Pain management: pain level controlled Vital Signs Assessment: post-procedure vital signs reviewed and stable Respiratory status: spontaneous breathing, nonlabored ventilation, respiratory function stable and patient connected to nasal cannula oxygen Cardiovascular status: blood pressure returned to baseline and stable Postop Assessment: no apparent nausea or vomiting Anesthetic complications: no    Last Vitals:  Vitals:   02/13/19 0405 02/13/19 0600  BP: (!) 144/75   Pulse:  83  Resp: 19   Temp: 36.8 C   SpO2: 94%     Last Pain:  Vitals:   02/13/19 0405  TempSrc: Oral  PainSc: 0-No pain                 Gerell Fortson S

## 2019-02-13 NOTE — Discharge Instructions (Signed)
[facility]  Discharge Instructions   Carotid Surgery  Please refer to the following instructions for your post-procedure care. Your surgeon or physician assistant will discuss any changes with you.  Activity  You are encouraged to walk as much as you can. You can slowly return to normal activities but must avoid strenuous activity and heavy lifting until your doctor tell you it's okay. Avoid activities such as vacuuming or swinging a golf club. You can drive after one week if you are comfortable and you are no longer taking prescription pain medications. It is normal to feel tired for serval weeks after your surgery. It is also normal to have difficulty with sleep habits, eating, and bowel movements after surgery. These will go away with time.  Bathing/Showering  Shower daily after you go home. Do not soak in a bathtub, hot tub, or swim until the incision heals completely.  Incision Care  Shower every day. Clean your incision with mild soap and water. Pat the area dry with a clean towel. You do not need a bandage unless otherwise instructed. Do not apply any ointments or creams to your incision. You may have skin glue on your incision. Do not peel it off. It will come off on its own in about one week. Your incision may feel thickened and raised for several weeks after your surgery. This is normal and the skin will soften over time.   For Men Only: It's okay to shave around the incision but do not shave the incision itself for 2 weeks. It is common to have numbness under your chin that could last for several months.  Diet  Resume your normal diet. There are no special food restrictions following this procedure. A low fat/low cholesterol diet is recommended for all patients with vascular disease. In order to heal from your surgery, it is CRITICAL to get adequate nutrition. Your body requires vitamins, minerals, and protein. Vegetables are the best source of  vitamins and minerals. Vegetables also provide the perfect balance of protein. Processed food has little nutritional value, so try to avoid this.  Medications  Resume taking all of your medications unless your doctor or physician assistant tells you not to. If your incision is causing pain, you may take over-the- counter pain relievers such as acetaminophen (Tylenol). If you were prescribed a stronger pain medication, please be aware these medications can cause nausea and constipation. Prevent nausea by taking the medication with a snack or meal. Avoid constipation by drinking plenty of fluids and eating foods with a high amount of fiber, such as fruits, vegetables, and grains.  Do not take Tylenol if you are taking prescription pain medications.  Follow Up  Our office will schedule a follow up appointment 2-3 weeks following discharge.  Please call us immediately for any of the following conditions   Increased pain, redness, drainage (pus) from your incision site.  Fever of 101 degrees or higher.  If you should develop stroke (slurred speech, difficulty swallowing, weakness on one side of your body, loss of vision) you should call 911 and go to the nearest emergency room.   Reduce your risk of vascular disease:   Stop smoking. If you would like help call [facility] at 1-800-QUIT-NOW (613) 855-1940) or [facility] at 9123667722.  Manage your cholesterol  Maintain a desired weight  Control your diabetes  Keep your blood pressure down   If you have any questions, please call the office at [phone].  Information on my medicine - XARELTO (Rivaroxaban)  Why was Xarelto prescribed for you? Xarelto was prescribed for you to reduce the risk of a blood clot forming that can cause a stroke if you have a medical condition called atrial fibrillation (a type of irregular heartbeat).  What do you need to know about xarelto ? Take your Xarelto ONCE DAILY at the same  time every day with your evening meal. If you have difficulty swallowing the tablet whole, you may crush it and mix in applesauce just prior to taking your dose.  Take Xarelto exactly as prescribed by your doctor and DO NOT stop taking Xarelto without talking to the doctor who prescribed the medication.  Stopping without other stroke prevention medication to take the place of Xarelto may increase your risk of developing a clot that causes a stroke.  Refill your prescription before you run out.  After discharge, you should have regular check-up appointments with your healthcare provider that is prescribing your Xarelto.  In the future your dose may need to be changed if your kidney function or weight changes by a significant amount.  What do you do if you miss a dose? If you are taking Xarelto ONCE DAILY and you miss a dose, take it as soon as you remember on the same day then continue your regularly scheduled once daily regimen the next day. Do not take two doses of Xarelto at the same time or on the same day.   Important Safety Information A possible side effect of Xarelto is bleeding. You should call your healthcare provider right away if you experience any of the following: ? Bleeding from an injury or your nose that does not stop. ? Unusual colored urine (red or dark brown) or unusual colored stools (red or black). ? Unusual bruising for unknown reasons. ? A serious fall or if you hit your head (even if there is no bleeding).  Some medicines may interact with Xarelto and might increase your risk of bleeding while on Xarelto. To help avoid this, consult your healthcare provider or pharmacist prior to using any new prescription or non-prescription medications, including herbals, vitamins, non-steroidal anti-inflammatory drugs (NSAIDs) and supplements.  This website has more information on Xarelto: [email or web address].[email or web address].

## 2019-02-13 NOTE — Evaluation (Signed)
Clinical/Bedside Swallow Evaluation Patient Details  Name: Marc Morris MRN: [id number] Date of Birth: Sep 25, 1940  Today's Date: 02/13/2019 Time: SLP Start Time (ACUTE ONLY): 1018 SLP Stop Time (ACUTE ONLY): 1031 SLP Time Calculation (min) (ACUTE ONLY): 13 min  Past Medical History:  Past Medical History:  Diagnosis Date  . COPD (chronic obstructive pulmonary disease)   . Headache   . Hematuria - cause not known 2012   scoped by urologist in [place], did not find a cause.  Marland Kitchen Hypertension   . Pneumonia 2012   hospitalized at [facility]  . Seizures    age 53- was dinking alot of alchol  . Shortness of breath    after standing, then it "gets all right"  . Vision loss    "needs glasses"   Past Surgical History:  Past Surgical History:  Procedure Laterality Date  . CYSTOSCOPY  2012   HPI:  Pt is a 78 y/o male s/p removal of central line with repair of R common carotid and internal jugular on 7/1. PMH: PNA (multiple times per pt, as recently as the past year),  atrial fibrillation, HTN, COPD, HLD, CAD, alcohol abuse, and seizures   Assessment / Plan / Recommendation Clinical Impression  Pt has multiple, audible swallows with purees and thin liquids, and as boluses continue, he begins to have increased coughing after he swallows regardless of texture. Question if there could be a more chronic underlying issue (esophageal?) given pt report of mild difficulty at home and multiple PNAs, although an acute pharyngeal component could be possible edema and AMS, although this is improving per RN. Recommend allowing meds in puree and ice chips today. SLP will f/u on next date for signs of improvement as his mentation continues to clear and additional time passes post-operatively. If signs of dysphagia persist, will proceed with MBS. SLP Visit Diagnosis: Dysphagia, unspecified (R13.10)    Aspiration Risk  Moderate aspiration risk    Diet Recommendation NPO except meds;Ice chips  PRN after oral care   Medication Administration: Crushed with puree    Other  Recommendations Oral Care Recommendations: Oral care QID Other Recommendations: Have oral suction available   Follow up Recommendations (tba)      Frequency and Duration min 2x/week  2 weeks       Prognosis Prognosis for Safe Diet Advancement: Good      Swallow Study   General HPI: Pt is a 78 y/o male s/p removal of central line with repair of R common carotid and internal jugular on 7/1. PMH: PNA (multiple times per pt, as recently as the past year),  atrial fibrillation, HTN, COPD, HLD, CAD, alcohol abuse, and seizures Type of Study: Bedside Swallow Evaluation Previous Swallow Assessment: none in chart Diet Prior to this Study: NPO Temperature Spikes Noted: No Respiratory Status: Nasal cannula History of Recent Intubation: No Behavior/Cognition: Alert;Cooperative;Requires cueing Oral Cavity Assessment: Within Functional Limits Oral Care Completed by SLP: No Oral Cavity - Dentition: Edentulous;Dentures, not available Vision: Functional for self-feeding Self-Feeding Abilities: Able to feed self Patient Positioning: Upright in bed Baseline Vocal Quality: Normal Volitional Cough: Weak Volitional Swallow: Able to elicit    Oral/Motor/Sensory Function Overall Oral Motor/Sensory Function: Generalized oral weakness   Ice Chips Ice chips: Not tested   Thin Liquid Thin Liquid: Impaired Presentation: Self Fed;Straw Pharyngeal  Phase Impairments: Multiple swallows;Cough - Delayed    Nectar Thick Nectar Thick Liquid: Not tested   Honey Thick Honey Thick Liquid: Not tested   Puree Puree: Impaired  Presentation: Self Fed;Spoon Pharyngeal Phase Impairments: Multiple swallows;Cough - Delayed   Solid     Solid: Not tested      Virl AxeLaura P Malvika Tung 02/13/2019,11:16 AM   Ivar DrapeLaura Alixander Rallis, M.A. CCC-SLP Acute Herbalistehabilitation Services Pager [phone] Office [phone]

## 2019-02-13 NOTE — Progress Notes (Addendum)
Patient Demographics:    Marc Morris, is a 78 y.o. male, DOB - [birth year], XLK:[id number]  Admit date - 02/12/2019   Admitting Physician Rosetta Posner, MD  Outpatient Primary MD for the patient is Raelyn Number, MD  LOS - 1   No chief complaint on file.       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  Active Problems:   AF (paroxysmal atrial fibrillation) (HCC)   Essential hypertension   COPD (chronic obstructive pulmonary disease) (HCC)   History of alcohol abuse   Near syncope   Falls   Chronic respiratory failure with hypoxia -- 2L/min  Brief Summary 78 yo with past medical history relevant for Paroxysmal atrial fibrillation, HTN, COPD, HLD, CAD, alcohol abuse, and seizures transferred from [facility] on 02/12/2019 after accidentally cannulization of the right carotid artery internal jugular during placement of a central line-  s/p removal of central line with repair of R common carotid and internal jugular by Dr Donnetta Hutching on 02/12/19  A/p 1) s/p removal of central line with repair of R common carotid and internal jugular -----procedure on 02/12/2019 per vascular surgery patient stable  2)PAfib--- restart Xarelto, continue metoprolol gram p.o. twice daily for rate control, echo with EF of 55 to 60% with dCHF  3)Social/Ethics--Discussed with pt and Niece (Marc Morris), and care taker (Marc Morris), apparently sister Marc Morris has dementia and unable to make decisions with patient, her listed number [phone] is inactive), sister Marc Morris can be reached at [phone]  4) history of alcohol abuse--- stable at this time no evidence of frank DTs, continue thiamine, folic acid continue lorazepam per CIWA protocol   5)COPD/tobacco abuse--patient was on prednisone prior to admission,  continue prednisone 20 mg daily , smoking cessation advised, continue bronchodilators, no acute COPD exacerbation at this time  6) depression and anxiety--continue Remeron 15 mg nightly, Prozac as ordered lorazepam as needed  Disposition/Need for in-Hospital Stay- patient unable to be discharged at this time due to awaiting possible placement to SNF facility family prefers [facility] in [place]  Code Status : Full   Family Communication:   NA (patient is alert, awake and coherent)   Disposition Plan  :--- awaiting possible placement to SNF facility family prefers [facility] in [place]  Consults  :  Vascular  DVT Prophylaxis  :  xarelto  Lab Results  Component Value Date   PLT 244 02/13/2019    Inpatient Medications  Scheduled Meds: . aspirin EC  81 mg Oral Daily  . donepezil  5 mg Oral Daily  . FLUoxetine  20 mg Oral Daily  . folic acid  1 mg Oral Daily  . LORazepam  0-4 mg Intravenous Q6H   Followed by  . [START ON 02/14/2019] LORazepam  0-4 mg Intravenous Q12H  . metoprolol tartrate  25 mg Oral BID  . mirtazapine  15 mg Oral Daily  . mometasone-formoterol  2 puff Inhalation BID  . montelukast  10 mg Oral Daily  . multivitamin with minerals  1 tablet Oral Daily  .  pantoprazole  40 mg Oral Daily  . pravastatin  40 mg Oral Daily  . [START ON 02/14/2019] predniSONE  20 mg Oral Q breakfast  . rivaroxaban  20 mg Oral Q supper  . roflumilast  500 mcg Oral Daily  . senna-docusate  2 tablet Oral QHS  . sodium chloride flush  3 mL Intravenous Q12H  . tamsulosin  0.4 mg Oral Daily  . thiamine  100 mg Oral Daily   Or  . thiamine  100 mg Intravenous Daily   Continuous Infusions: . dextrose 5 % and 0.9% NaCl     PRN Meds:.acetaminophen **OR** acetaminophen, albuterol, ALPRAZolam, hydrALAZINE, Ipratropium-Albuterol, LORazepam **OR** LORazepam, morphine injection, ondansetron **OR** ondansetron (ZOFRAN) IV,  oxyCODONE-acetaminophen, phenol    Anti-infectives (From admission, onward)   Start     Dose/Rate Route Frequency Ordered Stop   02/13/19 0000  ceFAZolin (ANCEF) IVPB 2g/100 mL premix     2 g 200 mL/hr over 30 Minutes Intravenous Every 8 hours 02/12/19 1845 02/13/19 0841        Objective:   Vitals:   02/13/19 0800 02/13/19 0815 02/13/19 0834 02/13/19 1200  BP: 119/71 119/71  91/63  Pulse: 80 79  79  Resp: 18   16  Temp: 98.3 F (36.8 C)   97.8 F (36.6 C)  TempSrc: Oral   Oral  SpO2: 96%  95% 93%  Weight:      Height:        Wt Readings from Last 3 Encounters:  02/12/19 51.8 kg  09/29/11 57.2 kg     Intake/Output Summary (Last 24 hours) at 02/13/2019 1414 Last data filed at 02/13/2019 0909 Gross per 24 hour  Intake 1550.49 ml  Output 575 ml  Net 975.49 ml     Physical Exam  Gen:- Awake Alert,  In no apparent distress  HEENT:- [place].AT, No sclera icterus Neck-Supple Neck,No JVD,.  Right neck area without significant hematoma Lungs-  CTAB , fair symmetrical air movement CV- S1, S2 normal, irregularly irregular  abd-  +ve B.Sounds, Abd Soft, No tenderness,    Extremity/Skin:- No  edema, pedal pulses present Psych-affect is appropriate, oriented x3 Neuro-no new focal deficits, no tremors   Data Review:   Micro Results Recent Results (from the past 240 hour(s))  SARS Coronavirus 2 (CEPHEID - Performed in [facility] lab), Hosp Order     Status: None   Collection Time: 02/13/19  1:25 AM   Specimen: Nasopharyngeal Swab  Result Value Ref Range Status   SARS Coronavirus 2 NEGATIVE NEGATIVE Final    Comment: (NOTE) If result is NEGATIVE SARS-CoV-2 target nucleic acids are NOT DETECTED. The SARS-CoV-2 RNA is generally detectable in upper and lower  respiratory specimens during the acute phase of infection. The lowest  concentration of SARS-CoV-2 viral copies this assay can detect is 250  copies / mL. A negative result does not preclude SARS-CoV-2 infection   and should not be used as the sole basis for treatment or other  patient management decisions.  A negative result may occur with  improper specimen collection / handling, submission of specimen other  than nasopharyngeal swab, presence of viral mutation(s) within the  areas targeted by this assay, and inadequate number of viral copies  (<250 copies / mL). A negative result must be combined with clinical  observations, patient history, and epidemiological information. If result is POSITIVE SARS-CoV-2 target nucleic acids are DETECTED. The SARS-CoV-2 RNA is generally detectable in upper and lower  respiratory specimens dur ing  the acute phase of infection.  Positive  results are indicative of active infection with SARS-CoV-2.  Clinical  correlation with patient history and other diagnostic information is  necessary to determine patient infection status.  Positive results do  not rule out bacterial infection or co-infection with other viruses. If result is PRESUMPTIVE POSTIVE SARS-CoV-2 nucleic acids MAY BE PRESENT.   A presumptive positive result was obtained on the submitted specimen  and confirmed on repeat testing.  While 2019 novel coronavirus  (SARS-CoV-2) nucleic acids may be present in the submitted sample  additional confirmatory testing may be necessary for epidemiological  and / or clinical management purposes  to differentiate between  SARS-CoV-2 and other Sarbecovirus currently known to infect humans.  If clinically indicated additional testing with an alternate test  methodology [phone] is advised. The SARS-CoV-2 RNA is generally  detectable in upper and lower respiratory sp ecimens during the acute  phase of infection. The expected result is Negative. Fact Sheet for Patients:  BoilerBrush.com.cyhttps://www.fda.gov/media/136312/download Fact Sheet for Healthcare Providers: https://pope.com/https://www.fda.gov/media/136313/download This test is not yet approved or cleared by the [place] FDA and  has been authorized for detection and/or diagnosis of SARS-CoV-2 by FDA under an Emergency Use Authorization (EUA).  This EUA will remain in effect (meaning this test can be used) for the duration of the COVID-19 declaration under Section 564(b)(1) of the Act, 21 U.S.C. section 360bbb-3(b)(1), unless the authorization is terminated or revoked sooner. Performed at [facility], [address] [place], [place] [address]     Radiology Reports No results found.   CBC Recent Labs  Lab 02/12/19 2048 02/13/19 0320  WBC 7.3 7.1  HGB 14.6 13.9  HCT 41.1 39.2  PLT 233 244  MCV 86.2 86.3  MCH 30.6 30.6  MCHC 35.5 35.5  RDW 14.8 14.6    Chemistries  Recent Labs  Lab 02/12/19 2048 02/13/19 0320  NA 129* 130*  K 6.4* 5.1  CL 90* 91*  CO2 22 22  GLUCOSE 87 86  BUN 6* 8  CREATININE 0.83 0.76  CALCIUM 8.6* 8.9   ------------------------------------------------------------------------------------------------------------------ No results for input(s): CHOL, HDL, LDLCALC, TRIG, CHOLHDL, LDLDIRECT in the last 72 hours.  No results found for: HGBA1C ------------------------------------------------------------------------------------------------------------------ No results for input(s): TSH, T4TOTAL, T3FREE, THYROIDAB in the last 72 hours.  Invalid input(s): FREET3 ------------------------------------------------------------------------------------------------------------------ No results for input(s): VITAMINB12, FOLATE, FERRITIN, TIBC, IRON, RETICCTPCT in the last 72 hours.  Coagulation profile No results for input(s): INR, PROTIME in the last 168 hours.  No results for input(s): DDIMER in the last 72 hours.  Cardiac Enzymes No results for input(s): CKMB, TROPONINI, MYOGLOBIN in the last 168 hours.  Invalid input(s): CK ------------------------------------------------------------------------------------------------------------------ No results found for: BNP    Shon Haleourage Dawnya Grams M.D on 02/13/2019 at 2:14 PM  Go to [email or web address] - for contact info  [facility] - Office  [phone]

## 2019-02-13 NOTE — Progress Notes (Addendum)
  Progress Note    02/13/2019 7:24 AM 1 Day Post-Op  Subjective:  Somnolent this morning, answering questions slowly   Vitals:   02/13/19 0405 02/13/19 0600  BP: (!) 144/75   Pulse:  83  Resp: 19   Temp: 98.3 F (36.8 C)   SpO2: 94%    Physical Exam: Lungs:  Non labored Incisions:  R neck incision c/d/i Extremities:  Symmetrical grip strength Neurologic: baseline  CBC    Component Value Date/Time   WBC 7.1 02/13/2019 0320   RBC 4.54 02/13/2019 0320   HGB 13.9 02/13/2019 0320   HCT 39.2 02/13/2019 0320   PLT 244 02/13/2019 0320   MCV 86.3 02/13/2019 0320   MCH 30.6 02/13/2019 0320   MCHC 35.5 02/13/2019 0320   RDW 14.6 02/13/2019 0320    BMET    Component Value Date/Time   NA 130 (L) 02/13/2019 0320   K 5.1 02/13/2019 0320   CL 91 (L) 02/13/2019 0320   CO2 22 02/13/2019 0320   GLUCOSE 86 02/13/2019 0320   BUN 8 02/13/2019 0320   CREATININE 0.76 02/13/2019 0320   CALCIUM 8.9 02/13/2019 0320   GFRNONAA >60 02/13/2019 0320   GFRAA >60 02/13/2019 0320    INR No results found for: INR   Intake/Output Summary (Last 24 hours) at 02/13/2019 0724 Last data filed at 02/13/2019 0434 Gross per 24 hour  Intake 1550.49 ml  Output 25 ml  Net 1525.49 ml     Assessment/Plan:  78 y.o. male is s/p removal of central line with repair of R common carotid and internal jugular 1 Day Post-Op   Neuro exam remains at baseline; grip strength symmetrical Incision unremarkable 81mg  aspirin prescribed Ok for discharge from vascular surgery standpoint when medically ready   Dagoberto Ligas, PA-C [facility] [phone] 02/13/2019 7:24 AM  I have examined the patient, reviewed and agree with above.  Neck without hematoma.  Neurologically intact.  Will arrange follow-up with me in several weeks.  Will not follow this weekend.  Call if we can provide assistance  Curt Jews, MD 02/13/2019 10:18 AM

## 2019-02-14 ENCOUNTER — Inpatient Hospital Stay (HOSPITAL_COMMUNITY): Payer: Medicare Other

## 2019-02-14 DIAGNOSIS — J9611 Chronic respiratory failure with hypoxia: Secondary | ICD-10-CM

## 2019-02-14 MED ORDER — PREDNISONE 20 MG PO TABS
20.0000 mg | ORAL_TABLET | Freq: Every day | ORAL | Status: AC
  Administered 2019-02-15: 20 mg via ORAL
  Filled 2019-02-14: qty 1

## 2019-02-14 MED ORDER — GUAIFENESIN ER 600 MG PO TB12
600.0000 mg | ORAL_TABLET | Freq: Two times a day (BID) | ORAL | Status: DC
  Administered 2019-02-14 – 2019-02-18 (×9): 600 mg via ORAL
  Filled 2019-02-14 (×9): qty 1

## 2019-02-14 NOTE — Progress Notes (Signed)
Physical Therapy Treatment Patient Details Name: Marc Morris MRN: [id number] DOB: May 07, 1941 Today's Date: 02/14/2019    History of Present Illness Marc Morris is a 78 y.o. male with medical history significant of atrial fibrillation, HTN, COPD, HLD, CAD, alcohol abuse, and seizures; who presented with complaints of dizziness and fall.  Patient was in the process of being admitted for syncope and what sounds like failure to thrive work-up.    PT Comments    Minimal interaction.  Pt follows repeated cues/instruction if given time.  Improved stability with the RW.   Follow Up Recommendations  SNF;Supervision/Assistance - 24 hour;Other (comment)     Equipment Recommendations  None recommended by PT    Recommendations for Other Services       Precautions / Restrictions Precautions Precautions: Fall    Mobility  Bed Mobility Overal bed mobility: Needs Assistance Bed Mobility: Supine to Sit;Sit to Supine     Supine to sit: Min guard Sit to supine: Min guard   General bed mobility comments: needs extra time, but can even reposition himself without assist  Transfers Overall transfer level: Needs assistance   Transfers: Sit to/from Stand Sit to Stand: Supervision            Ambulation/Gait Ambulation/Gait assistance: Min guard Gait Distance (Feet): 200 Feet(x2) Assistive device: Rolling walker (2 wheeled) Gait Pattern/deviations: Step-through pattern Gait velocity: slower   General Gait Details: episodes of unsteadiness, but overall better with RW.   Stairs             Wheelchair Mobility    Modified Rankin (Stroke Patients Only)       Balance     Sitting balance-Leahy Scale: Good       Standing balance-Leahy Scale: Fair Standing balance comment: fair statically, but needing external support or AD dynamically                            Cognition Arousal/Alertness: Awake/alert Behavior During Therapy: WFL for tasks  assessed/performed Overall Cognitive Status: No family/caregiver present to determine baseline cognitive functioning                                        Exercises      General Comments General comments (skin integrity, edema, etc.): SpO2 difficult to get, but one ready at 90% on 3L at 90 bpm      Pertinent Vitals/Pain Pain Assessment: No/denies pain    Home Living                      Prior Function            PT Goals (current goals can now be found in the care plan section) Acute Rehab PT Goals Time For Goal Achievement: 02/27/19 Potential to Achieve Goals: Fair Progress towards PT goals: Progressing toward goals    Frequency    Min 2X/week      PT Plan Current plan remains appropriate    Co-evaluation              AM-PAC PT "6 Clicks" Mobility   Outcome Measure  Help needed turning from your back to your side while in a flat bed without using bedrails?: None Help needed moving from lying on your back to sitting on the side of a flat bed without using bedrails?: None Help  needed moving to and from a bed to a chair (including a wheelchair)?: None Help needed standing up from a chair using your arms (e.g., wheelchair or bedside chair)?: A Little Help needed to walk in hospital room?: A Little Help needed climbing 3-5 steps with a railing? : A Little 6 Click Score: 21    End of Session Equipment Utilized During Treatment: Oxygen Activity Tolerance: Patient tolerated treatment well Patient left: in bed;with call bell/phone within reach Nurse Communication: Mobility status PT Visit Diagnosis: Unsteadiness on feet (R26.81);Adult, failure to thrive (R62.7)     Time: [id number] PT Time Calculation (min) (ACUTE ONLY): 28 min  Charges:  $Gait Training: 8-22 mins $Therapeutic Activity: 8-22 mins                     02/14/2019  Marc Morris, PT Acute Rehabilitation Services [phone]  (pager) [phone]   (office)   Marc Morris 02/14/2019, 5:21 PM

## 2019-02-14 NOTE — Progress Notes (Signed)
Modified Barium Swallow Progress Note  Patient Details  Name: Marc Morris MRN: [id number] Date of Birth: Mar 10, 1941  Today's Date: 02/14/2019  Modified Barium Swallow completed.  Full report located under Chart Review in the Imaging Section.  Brief recommendations include the following:  Clinical Impression  Pt has a mild-moderate oropharyngeal dysphagia with suspected esophageal dysphagia as well. He has premature spillage of thin liquids as well as reduced base of tongue retraction, pharyngeal squeeze, and hyolaryngeal movement, with reduced epiglottic inversion. As a result, there is penetration and residue primarily in the valleculae with all consistencies. Thin liquids residuals do fall further to sit in the pyriform sinuses as well. Penetration is transient in nature, even when larger boluses of thin liquids reach all the way to the vocal folds before being ejected. There were two times in which he did not fully clear penetrates upon completion of the swallow, but both times he cleared it with a spontaneous throat clear. Coughing was noted intermittently throughout the study, as was a wet vocal quality, but with no correlation to airway compromise.  Upon a brief esophageal scan, there appeared to be a column of barium above the barium tablet, which took several minutes to enter the stomach (MD not present for confirmation). He had retrograde flow of the barium that remained in the esophagus but did approach re-entering the pharynx. Recommend Dys 2 diet to facilitate oral preparation as he doesn't have his dentures, but also to ensure adequate mastication in light of concern for esophageal issues. Thin liquids would be appropriate, but overall would recommend using aspiration and esophageal precautions. SLP will f/u for tolerance and implementation of strategies, including use of a second swallow to reduce residue.   Swallow Evaluation Recommendations       SLP Diet Recommendations:  Dysphagia 2 (Fine chop) solids;Thin liquid   Liquid Administration via: Cup;Straw   Medication Administration: Crushed with puree   Supervision: Patient able to self feed;Full supervision/cueing for compensatory strategies   Compensations: Slow rate;Small sips/bites;Multiple dry swallows after each bite/sip;Follow solids with liquid   Postural Changes: Seated upright at 90 degrees;Remain semi-upright after after feeds/meals (Comment)   Oral Care Recommendations: Oral care BID        Marc Morris 02/14/2019,3:20 PM   Pollyann Glen, M.A. [place] Acute Environmental education officer [phone] Office [phone]

## 2019-02-14 NOTE — Progress Notes (Signed)
Patient Demographics:    Marc Morris, is a 78 y.o. male, DOB - October 13, 1940, YOV:[id number]  Admit date - 02/12/2019   Admitting Physician Rosetta Posner, MD  Outpatient Primary MD for the patient is Raelyn Number, MD  LOS - 2   No chief complaint on file.       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  Active Problems:   AF (paroxysmal atrial fibrillation) (HCC)   Essential hypertension   COPD (chronic obstructive pulmonary disease) (HCC)   History of alcohol abuse   Near syncope   Falls   Chronic respiratory failure with hypoxia -- 2L/min  Brief Summary 78 yo with past medical history relevant for Paroxysmal atrial fibrillation, HTN, COPD, HLD, CAD, alcohol abuse, and seizures transferred [facility] on 02/12/2019 after accidentally cannulization of the right carotid artery internal jugular during placement of a central line-  s/p removal of central line with repair of R common carotid and internal jugular on Dr Donnetta Hutching on 02/12/19.. Now awaiting SNF placement  A/p 1) s/p removal of central line with repair of R common carotid and internal jugular -----procedure on 02/12/2019 per vascular surgery patient stable... No hematoma  2)PAfib--- stable, c/n Xarelto, continue metoprolol 25 mg p.o. twice daily for rate control, echo with EF of 55 to 60% and  dCHF  3)Social/Ethics--Discussed with pt and Niece (Tammy Muscoy), and care taker (Ms Nolberto Hanlon), apparently sister Elijahjames Fuelling has dementia and unable to make decisions with patient, her listed number [phone] is inactive), sister Theda Sers can be reached at [phone]  4) history of alcohol abuse--- stable at this time no evidence of frank DTs, continue thiamine, folic  acid continue lorazepam per CIWA protocol  5)COPD/tobacco abuse--patient was on prednisone prior to admission, okay to complete prednisone on 02/15/2019, smoking cessation advised, continue bronchodilators, no acute COPD exacerbation at this time  6) depression and anxiety--stable, continue Remeron 15 mg nightly, Prozac as ordered lorazepam as needed  7)Dysphagia--swallowing difficulties and aspiration concerns persist, n.p.o. for now, continue IV fluids pending MBS study, discussed with speech pathologist.... Depending on finding of MBS patient may need consult for PEG tube placement or modified diet  Disposition/Need for in-Hospital Stay- patient unable to be discharged at this time due to awaiting possible placement to SNF facility family prefers [facility] in [place].... Also having difficulties with swallowing  Code Status : Full   Family Communication:   --- (patient is alert, awake and coherent) ---- Discussed with  Niece Lynelle Smoke [place]), and care taker (Ms Nolberto Hanlon), apparently sister Tery Hoeger has dementia and unable to make decisions with patient, her listed number [phone] is inactive), sister Theda Sers can be reached at [phone]  Disposition Plan  :--- awaiting possible placement to SNF facility family prefers [facility] in [place]  Consults  :  Vascular/Speech  DVT Prophylaxis  :  xarelto  Lab Results  Component Value Date   PLT 244 02/13/2019    Inpatient Medications  Scheduled Meds: . donepezil  5 mg Oral Daily  . FLUoxetine  20 mg Oral Daily  . folic acid  1 mg Oral Daily  . guaiFENesin  600 mg Oral BID  . LORazepam  0-4 mg Intravenous Q6H   Followed by  . LORazepam  0-4 mg Intravenous Q12H  . metoprolol tartrate  25 mg Oral BID  . mirtazapine  15 mg Oral QHS  . mometasone-formoterol  2 puff Inhalation BID  . montelukast  10 mg Oral Daily  . multivitamin with minerals  1 tablet Oral Daily  . pantoprazole  40 mg Oral Daily   . pravastatin  40 mg Oral Daily  . predniSONE  20 mg Oral Q breakfast  . rivaroxaban  20 mg Oral Q supper  . roflumilast  500 mcg Oral Daily  . senna-docusate  2 tablet Oral QHS  . sodium chloride flush  3 mL Intravenous Q12H  . tamsulosin  0.4 mg Oral Daily  . thiamine  100 mg Oral Daily   Or  . thiamine  100 mg Intravenous Daily   Continuous Infusions: . dextrose 5 % and 0.9% NaCl 40 mL/hr at 02/13/19 1844   PRN Meds:.acetaminophen **OR** acetaminophen, albuterol, ALPRAZolam, hydrALAZINE, Ipratropium-Albuterol, LORazepam **OR** LORazepam, morphine injection, ondansetron **OR** ondansetron (ZOFRAN) IV, oxyCODONE-acetaminophen, phenol    Anti-infectives (From admission, onward)   Start     Dose/Rate Route Frequency Ordered Stop   02/13/19 0000  ceFAZolin (ANCEF) IVPB 2g/100 mL premix     2 g 200 mL/hr over 30 Minutes Intravenous Every 8 hours 02/12/19 1845 02/13/19 0841        Objective:   Vitals:   02/14/19 0758 02/14/19 0807 02/14/19 0841 02/14/19 1159  BP: 120/74   101/74  Pulse: 67 72  60  Resp: 18   18  Temp: 98.4 F (36.9 C)   98.4 F (36.9 C)  TempSrc: Oral   Oral  SpO2: 98%  94% 96%  Weight:      Height:        Wt Readings from Last 3 Encounters:  02/12/19 51.8 kg  09/29/11 57.2 kg     Intake/Output Summary (Last 24 hours) at 02/14/2019 1501 Last data filed at 02/14/2019 1351 Gross per 24 hour  Intake 611.1 ml  Output 900 ml  Net -288.9 ml     Physical Exam  Gen:- Awake Alert,  In no apparent distress  HEENT:- [place].AT, No sclera icterus Neck-Supple Neck,No JVD,.  Right neck area without significant hematoma Lungs-  CTAB , fair symmetrical air movement CV- S1, S2 normal, irregularly irregular  abd-  +ve B.Sounds, Abd Soft, No tenderness,    Extremity/Skin:- No  edema, pedal pulses present Psych-affect is appropriate, oriented x3 Neuro-Generalized weakness, but no new focal deficits, no tremors   Data Review:   Micro Results Recent Results  (from the past 240 hour(s))  SARS Coronavirus 2 (CEPHEID - Performed in [facility] hospital lab), Hosp Order     Status: None   Collection Time: 02/13/19  1:25 AM   Specimen: Nasopharyngeal Swab  Result Value Ref Range Status   SARS Coronavirus 2 NEGATIVE NEGATIVE Final    Comment: (NOTE) If result is NEGATIVE SARS-CoV-2 target nucleic acids are NOT DETECTED. The SARS-CoV-2 RNA is generally detectable in upper and lower  respiratory specimens during the acute phase of infection. The lowest  concentration of SARS-CoV-2 viral copies this assay can detect is 250  copies / mL. A negative result does not preclude SARS-CoV-2 infection  and should not be used as the sole basis for treatment or  other  patient management decisions.  A negative result may occur with  improper specimen collection / handling, submission of specimen other  than nasopharyngeal swab, presence of viral mutation(s) within the  areas targeted by this assay, and inadequate number of viral copies  (<250 copies / mL). A negative result must be combined with clinical  observations, patient history, and epidemiological information. If result is POSITIVE SARS-CoV-2 target nucleic acids are DETECTED. The SARS-CoV-2 RNA is generally detectable in upper and lower  respiratory specimens dur ing the acute phase of infection.  Positive  results are indicative of active infection with SARS-CoV-2.  Clinical  correlation with patient history and other diagnostic information is  necessary to determine patient infection status.  Positive results do  not rule out bacterial infection or co-infection with other viruses. If result is PRESUMPTIVE POSTIVE SARS-CoV-2 nucleic acids MAY BE PRESENT.   A presumptive positive result was obtained on the submitted specimen  and confirmed on repeat testing.  While 2019 novel coronavirus  (SARS-CoV-2) nucleic acids may be present in the submitted sample  additional confirmatory testing may be  necessary for epidemiological  and / or clinical management purposes  to differentiate between  SARS-CoV-2 and other Sarbecovirus currently known to infect humans.  If clinically indicated additional testing with an alternate test  methodology [phone]([id number]) is advised. The SARS-CoV-2 RNA is generally  detectable in upper and lower respiratory sp ecimens during the acute  phase of infection. The expected result is Negative. Fact Sheet for Patients:  BoilerBrush.com.cyhttps://www.fda.gov/media/136312/download Fact Sheet for Healthcare Providers: https://pope.com/https://www.fda.gov/media/136313/download This test is not yet approved or cleared by the [place] FDA and has been authorized for detection and/or diagnosis of SARS-CoV-2 by FDA under an Emergency Use Authorization (EUA).  This EUA will remain in effect (meaning this test can be used) for the duration of the COVID-19 declaration under Section 564(b)(1) of the Act, 21 U.S.C. section 360bbb-3(b)(1), unless the authorization is terminated or revoked sooner. Performed at [facility], [address] [place], [place] [address]     Radiology Reports No results found.   CBC Recent Labs  Lab 02/12/19 2048 02/13/19 0320  WBC 7.3 7.1  HGB 14.6 13.9  HCT 41.1 39.2  PLT 233 244  MCV 86.2 86.3  MCH 30.6 30.6  MCHC 35.5 35.5  RDW 14.8 14.6    Chemistries  Recent Labs  Lab 02/12/19 2048 02/13/19 0320  NA 129* 130*  K 6.4* 5.1  CL 90* 91*  CO2 22 22  GLUCOSE 87 86  BUN 6* 8  CREATININE 0.83 0.76  CALCIUM 8.6* 8.9   ------------------------------------------------------------------------------------------------------------------ No results for input(s): CHOL, HDL, LDLCALC, TRIG, CHOLHDL, LDLDIRECT in the last 72 hours.  No results found for: HGBA1C ------------------------------------------------------------------------------------------------------------------ No results for input(s): TSH, T4TOTAL, T3FREE, THYROIDAB in the last 72 hours.   Invalid input(s): FREET3 ------------------------------------------------------------------------------------------------------------------ No results for input(s): VITAMINB12, FOLATE, FERRITIN, TIBC, IRON, RETICCTPCT in the last 72 hours.  Coagulation profile No results for input(s): INR, PROTIME in the last 168 hours.  No results for input(s): DDIMER in the last 72 hours.  Cardiac Enzymes No results for input(s): CKMB, TROPONINI, MYOGLOBIN in the last 168 hours.  Invalid input(s): CK ------------------------------------------------------------------------------------------------------------------ No results found for: BNP   Shon Haleourage Alonie Gazzola M.D on 02/14/2019 at 3:01 PM  Go to [email or web address] - for contact info  [facility] - Office  [phone]

## 2019-02-14 NOTE — Care Management Important Message (Signed)
Important Message  Patient Details  Name: Marc Morris MRN: [id number] Date of Birth: 16-Mar-1941   Medicare Important Message Given:  Yes     [name] 02/14/2019, 1:21 PM

## 2019-02-14 NOTE — Progress Notes (Signed)
  Speech Language Pathology Treatment: Dysphagia  Patient Details Name: SAINT HANK MRN: [id number] DOB: Nov 18, 1940 Today's Date: 02/14/2019 Time: 1040-1050 SLP Time Calculation (min) (ACUTE ONLY): 10 min  Assessment / Plan / Recommendation Clinical Impression  Pt has less baseline coughing today but continues to have multiple, audible swallows and intermittent coughing with POs. Although signs of dysphagia are noted across thin liquids and purees, the coughing is primarily observed with thin liquids. Per pt report, "I do that all the time." Given suspicion for baseline dysphagia and perhaps esophageal component, will proceed with MBS to better assess oropharyngeal function and facilitate in making diet recommendations.   HPI HPI: Pt is a 78 y/o male s/p removal of central line with repair of R common carotid and internal jugular on 7/1. PMH: PNA (multiple times per pt, as recently as the past year),  atrial fibrillation, HTN, COPD, HLD, CAD, alcohol abuse, and seizures      SLP Plan  MBS       Recommendations  Diet recommendations: NPO;Other(comment)(ice chips after oral care) Medication Administration: Crushed with puree                Oral Care Recommendations: Oral care BID Follow up Recommendations: (tba) SLP Visit Diagnosis: Dysphagia, unspecified (R13.10) Plan: MBS       GO                Venita Sheffield Norlan Rann 02/14/2019, 11:01 AM  Pollyann Glen, M.A. [place] Acute Environmental education officer [phone] Office [phone]

## 2019-02-14 NOTE — TOC Initial Note (Signed)
Transition of Care [facility](TOC) - Initial/Assessment Note    Patient Details  Name: Marc Morris Pulley MRN: [id number] Date of Birth: April 24, 1941  Transition of Care [facility](TOC) CM/SW Contact:    Eduard Rouxynthia N Aundria Bitterman, LCSWA Phone Number: 02/14/2019, 5:41 PM  Clinical Narrative:                  CSW received consult for discharge needs. CSW spoke with patient regarding PT recommendation of SNF placement at time of discharge. Patient was slow to respond but responded appropriately. Patient states he has a caregiver that lives in the home with him 24/7 and he has an aide that come during the week. CSW inquired about ST rehab at SNF before going home and patient states he needed more time to think about it.   CSW spoke with the patient's niece, Farris Hasammy Sperl and she explained the patient may or may not be willing to go. Her preference is [facility] or [facility]. The patient's preference is [facility].  CSW explained the SNF process and advised the patient CSW will follow up over the weekend on  his decision home verses SNF?    Patient expressed understanding of CSW role and discharge process as well as medical condition. No questions/concerns about plan or treatment at this time.  Antony Blackbirdynthia Robie Mcniel, MSW, LCSWA Clinical Social Worker [phone]    Barriers to Discharge: Continued Medical Work up   Patient Goals and CMS Choice Patient states their goals for this hospitalization and ongoing recovery are:: patient to get better before going home      Expected Discharge Plan and Services   In-house Referral: Clinical Social Work     Living arrangements for the past 2 months: Single Family Home                                      Prior Living Arrangements/Services Living arrangements for the past 2 months: Single Family Home Lives with:: Other (Comment)(caregiver stays in the home with the patient.)          Need for Family Participation in Patient Care: Yes (Comment) Care giver  support system in place?: Yes (comment)   Criminal Activity/Legal Involvement Pertinent to Current Situation/Hospitalization: No - Comment as needed  Activities of Daily Living      Permission Sought/Granted Permission sought to share information with : Facility Medical sales representativeContact Representative, Family Supports Permission granted to share information with : Yes, Verbal Permission Granted  Share Information with NAME: Farris Hasammy Gundry  Permission granted to share info w AGENCY: SNFs  Permission granted to share info w Relationship: niece  Permission granted to share info w Contact Information: [phone]  Emotional Assessment Appearance:: Appears older than stated age Attitude/Demeanor/Rapport: Engaged Affect (typically observed): Appropriate, Quiet Orientation: : Oriented to Situation, Oriented to Place, Oriented to Self Alcohol / Substance Use: Alcohol Use Psych Involvement: No (comment)  Admission diagnosis:  Carotid injury Patient Active Problem List   Diagnosis Date Noted  . Chronic respiratory failure with hypoxia -- 2L/min 02/13/2019  . 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 AF (paroxysmal atrial fibrillation) (HCC) 02/12/2019  . Essential hypertension 02/12/2019  . COPD (chronic obstructive pulmonary disease) (HCC) 02/12/2019  . History of alcohol abuse 02/12/2019  . Near syncope 02/12/2019  . Falls 02/12/2019   PCP:  Shelbie AmmonsHaque, Imran P, MD [facility]   [facility] [place] [place] - [address]  [place] [address] Phone: [phone] Fax: [phone]     Social Determinants of Health (SDOH) Interventions    Readmission Risk Interventions No flowsheet data found.

## 2019-02-14 NOTE — Progress Notes (Addendum)
     Subjective  - Doing better   Objective 120/74 72 98.4 F (36.9 C) (Oral) 18 98%  Intake/Output Summary (Last 24 hours) at 02/14/2019 0825 Last data filed at 02/14/2019 0400 Gross per 24 hour  Intake 611.1 ml  Output 1150 ml  Net -538.9 ml    Right neck incision is healing well without hematoma Moving all 4 ext., grip 5/5 B UE, sensation intact Lungs non labored breathing  Assessment/Planning: POD # 2   77 y.o. male is s/p removal of central line with repair of R common carotid and internal jugular   Neuro exam at baseline moving all 4 ext, grip 5 B UE Ok for discharge from vascular surgery standpoint when medically ready  Roxy Horseman 02/14/2019 8:25 AM --  Laboratory Lab Results: Recent Labs    02/12/19 2048 02/13/19 0320  WBC 7.3 7.1  HGB 14.6 13.9  HCT 41.1 39.2  PLT 233 244   BMET Recent Labs    02/12/19 2048 02/13/19 0320  NA 129* 130*  K 6.4* 5.1  CL 90* 91*  CO2 22 22  GLUCOSE 87 86  BUN 6* 8  CREATININE 0.83 0.76  CALCIUM 8.6* 8.9    COAG No results found for: INR, PROTIME No results found for: PTT   I have interviewed and examined patient with PA and agree with assessment and plan above.   Shadara Lopez C. Donzetta Matters, MD [facility] Office: [phone] Pager: [phone]

## 2019-02-15 LAB — COMPREHENSIVE METABOLIC PANEL
ALT: 60 U/L — ABNORMAL HIGH (ref 0–44)
AST: 102 U/L — ABNORMAL HIGH (ref 15–41)
Albumin: 2.7 g/dL — ABNORMAL LOW (ref 3.5–5.0)
Alkaline Phosphatase: 59 U/L (ref 38–126)
Anion gap: 9 (ref 5–15)
BUN: 6 mg/dL — ABNORMAL LOW (ref 8–23)
CO2: 26 mmol/L (ref 22–32)
Calcium: 8.5 mg/dL — ABNORMAL LOW (ref 8.9–10.3)
Chloride: 103 mmol/L (ref 98–111)
Creatinine, Ser: 0.62 mg/dL (ref 0.61–1.24)
GFR calc Af Amer: >60 mL/min (ref >60)
GFR calc non Af Amer: >60 mL/min (ref >60)
Glucose, Bld: 146 mg/dL — ABNORMAL HIGH (ref 70–99)
Potassium: 3.4 mmol/L — ABNORMAL LOW (ref 3.5–5.1)
Sodium: 138 mmol/L (ref 135–145)
Total Bilirubin: 0.7 mg/dL (ref 0.3–1.2)
Total Protein: 5.2 g/dL — ABNORMAL LOW (ref 6.5–8.1)

## 2019-02-15 MED ORDER — METOCLOPRAMIDE HCL 5 MG/ML IJ SOLN
10.0000 mg | Freq: Once | INTRAMUSCULAR | Status: AC
  Administered 2019-02-15: 10 mg via INTRAVENOUS
  Filled 2019-02-15: qty 2

## 2019-02-15 NOTE — Progress Notes (Signed)
PROGRESS NOTE    Marc Morris  NWG:[id number] DOB: 1940/09/29 DOA: 02/12/2019 PCP: Shelbie Ammons, MD   Brief Narrative:  Patient is a 78 year old male with history of paroxysmal A. fib, hypertension, COPD, hyperlipidemia, coronary artery disease, alcohol abuse, seizures who was transferred from [facility] on 02/12/2019 after accidentally cannulization of the right carotid artery internal jugular during placement of a central line. He is s/p removal of central line with repair of R common carotid and internal jugularon Dr Arbie Cookey on 02/12/19.Now awaiting SNF placement.SW following.   He is hemodynamically stable for discharge to SNF as soon as bed is available.  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  Procedure done on 02/12/2019 by vascular surgery.  No hematoma.  Paroxysmal A. fib: Currently stable.  Heart rate controlled.  Continue Xarelto.  On metoprolol.  Echocardiogram showed ejection fraction of 55 to 60% and suggestive of diastolic CHF.  History of chronic alcohol abuse: Not in withdrawal at present.  Continue thiamine folic acid.  Continue CIWA protocol. Patient has mild elevated liver enzymes secondary to chronic alcohol abuse.  COPD/tobacco abuse: On prednisone.  Smoking cessation advised.  Continue bronchodilators as needed.  Not in acute exacerbation  Depression/anxiety: Continue Remeron and fluoxetine.  Currently stable.  Continue Ativan for anxiety  Dysphagia: Patient seen by speech.  Modified barium swallow done.  Recommended dysphagia 2  diet.  Upper kalemia: Supplemented with potassium.  Debility/deconditioning: Patient recommended by physical therapy for skilled nursing facility.  Social worker consulted.  Called Child psychotherapist , call not received.  Patient is hemodynamically stable for discharge.            DVT prophylaxis: xarelto Code Status: Full Family Communication: None pending at the bedside Disposition Plan: Skilled nursing facility as soon as the bed is available.   Consultants: Vascular surgery  Procedures: Removal of central line  Antimicrobials:  Anti-infectives (From admission, onward)   Start     Dose/Rate Route Frequency Ordered Stop   02/13/19 0000  ceFAZolin (ANCEF) IVPB 2g/100 mL premix     2 g 200 mL/hr over 30 Minutes Intravenous Every 8 hours 02/12/19 1845 02/13/19 0841      Subjective:  Patient seen and examined the bedside this morning.  Hemodynamically stable.  Alert and oriented.  He says he was still thinking about going to skilled nursing facility but apparently has agreed .  Denies any complaints  Objective: Vitals:   02/14/19 2337 02/15/19 0627 02/15/19 0841 02/15/19 0932  BP: 108/69 123/73 133/76 133/76  Pulse: 63 62  62  Resp: Temp: 98.2 F (36.8 C) 98.6 F (37 C) 98.2 F (36.8 C)   TempSrc: Oral Oral Oral   SpO2: 98% 97% 97%   Weight:      Height:        Intake/Output Summary (Last 24 hours) at 02/15/2019 1135 Last data filed at 02/15/2019 0631 Gross per 24 hour  Intake 3 ml  Output 840 ml  Net -837 ml   Filed Weights   02/12/19 1858  Weight:  51.8 kg    Examination:  General exam: Generalized weakness,Not in distress,average built HEENT:PERRL,Oral mucosa moist, Ear/Nose normal on gross exam Respiratory system: Bilateral equal air entry, normal vesicular breath sounds, no wheezes or crackles  Cardiovascular system: S1 & S2 heard, RRR. No JVD, murmurs, rubs, gallops or clicks. No pedal edema. Gastrointestinal system: Abdomen is nondistended,  soft and nontender. No organomegaly or masses felt. Normal bowel sounds heard. Central nervous system: Alert and oriented. No focal neurological deficits. Extremities: No edema, no clubbing ,no cyanosis, distal peripheral pulses palpable. Skin: No rashes, lesions or ulcers,no icterus ,no pallor   Data Reviewed: I have personally reviewed following labs and imaging studies  CBC: Recent Labs  Lab 02/12/19 2048 02/13/19 0320  WBC 7.3 7.1  HGB 14.6 13.9  HCT 41.1 39.2  MCV 86.2 86.3  PLT 233 244   Basic Metabolic Panel: Recent Labs  Lab 02/12/19 2048 02/13/19 0320 02/15/19 0147  NA 129* 130* 138  K 6.4* 5.1 3.4*  CL 90* 91* 103  CO2 22 22 26   GLUCOSE 87 86 146*  BUN 6* 8 6*  CREATININE 0.83 0.76 0.62  CALCIUM 8.6* 8.9 8.5*   GFR: Estimated Creatinine Clearance: 58.5 mL/min (by C-G formula based on SCr of 0.62 mg/dL). Liver Function Tests: Recent Labs  Lab 02/15/19 0147  AST 102*  ALT 60*  ALKPHOS 59  BILITOT 0.7  PROT 5.2*  ALBUMIN 2.7*   No results for input(s): LIPASE, AMYLASE in the last 168 hours. No results for input(s): AMMONIA in the last 168 hours. Coagulation Profile: No results for input(s): INR, PROTIME in the last 168 hours. Cardiac Enzymes: No results for input(s): CKTOTAL, CKMB, CKMBINDEX, TROPONINI in the last 168 hours. BNP (last 3 results) No results for input(s): PROBNP in the last 8760 hours. HbA1C: No results for input(s): HGBA1C in the last 72 hours. CBG: Recent Labs  Lab 02/12/19 1551  GLUCAP 102*   Lipid Profile: No results for input(s): CHOL, HDL, LDLCALC, TRIG, CHOLHDL, LDLDIRECT in the last 72 hours. Thyroid Function Tests: No results for input(s): TSH, T4TOTAL, FREET4, T3FREE, THYROIDAB in the last 72 hours. Anemia Panel: No results for input(s): VITAMINB12, FOLATE, FERRITIN, TIBC, IRON, RETICCTPCT in the last 72 hours. Sepsis Labs: No results for input(s): PROCALCITON, LATICACIDVEN in the last 168 hours.  Recent Results  (from the past 240 hour(s))  SARS Coronavirus 2 (CEPHEID - Performed in [facility] hospital lab), Hosp Order     Status: None   Collection Time: 02/13/19  1:25 AM   Specimen: Nasopharyngeal Swab  Result Value Ref Range Status   SARS Coronavirus 2 NEGATIVE NEGATIVE Final    Comment: (NOTE) If result is NEGATIVE SARS-CoV-2 target nucleic acids are NOT DETECTED. The SARS-CoV-2 RNA is generally detectable in upper and lower  respiratory specimens during the acute phase of infection. The lowest  concentration of SARS-CoV-2 viral copies this assay can detect is 250  copies / mL. A negative result does not preclude SARS-CoV-2 infection  and should not be used as the sole basis for treatment or other  patient management decisions.  A negative result may occur with  improper specimen collection / handling, submission of specimen other  than nasopharyngeal swab, presence of viral mutation(s) within the  areas targeted by this assay, and inadequate number of viral copies  (<250 copies / mL). A negative result must be combined with clinical  observations, patient history, and epidemiological information. If result is POSITIVE SARS-CoV-2 target nucleic acids are DETECTED.  The SARS-CoV-2 RNA is generally detectable in upper and lower  respiratory specimens dur ing the acute phase of infection.  Positive  results are indicative of active infection with SARS-CoV-2.  Clinical  correlation with patient history and other diagnostic information is  necessary to determine patient infection status.  Positive results do  not rule out bacterial infection or co-infection with other viruses. If result is PRESUMPTIVE POSTIVE SARS-CoV-2 nucleic acids MAY BE PRESENT.   A presumptive positive result was obtained on the submitted specimen  and confirmed on repeat testing.  While 2019 novel coronavirus  (SARS-CoV-2) nucleic acids may be present in the submitted sample  additional confirmatory testing may be  necessary for epidemiological  and / or clinical management purposes  to differentiate between  SARS-CoV-2 and other Sarbecovirus currently known to infect humans.  If clinically indicated additional testing with an alternate test  methodology [phone]([id number]) is advised. The SARS-CoV-2 RNA is generally  detectable in upper and lower respiratory sp ecimens during the acute  phase of infection. The expected result is Negative. Fact Sheet for Patients:  BoilerBrush.com.cyhttps://www.fda.gov/media/136312/download Fact Sheet for Healthcare Providers: https://pope.com/https://www.fda.gov/media/136313/download This test is not yet approved or cleared by the [place] FDA and has been authorized for detection and/or diagnosis of SARS-CoV-2 by FDA under an Emergency Use Authorization (EUA).  This EUA will remain in effect (meaning this test can be used) for the duration of the COVID-19 declaration under Section 564(b)(1) of the Act, 21 U.S.C. section 360bbb-3(b)(1), unless the authorization is terminated or revoked sooner. Performed at [facility], [address] [place], [place] [address]          Radiology Studies: Dg Swallowing Func-speech Pathology  Result Date: 02/14/2019 Objective Swallowing Evaluation: Type of Study: MBS-Modified Barium Swallow Study  Patient Details Name: Marc LighterJames J Njie MRN: [id number] Date of Birth: [birth year] Today's Date: 02/14/2019 Time: SLP Start Time (ACUTE ONLY): 1406 -SLP Stop Time (ACUTE ONLY): 1428 SLP Time Calculation (min) (ACUTE ONLY): 22 min Past Medical History: Past Medical History: Diagnosis Date  COPD (chronic obstructive pulmonary disease) (HCC)   Headache(784.0)   Hematuria - cause not known 2012  [place], did not find a cause.  Hypertension   Pneumonia 2012  [facility]  Seizures [facility](HCC)   age 78- was dinking alot of alchol  Shortness of breath   after standing, then it "gets all right"  Vision loss   "needs glasses" Past  Surgical History: Past Surgical History: Procedure Laterality Date  CYSTOSCOPY  2012  ENDARTERECTOMY Right 02/12/2019  Procedure: Right neck exploration and removal of carotid central line.;  Surgeon: Larina EarthlyEarly, Todd F, MD;  [facility];  Service: Vascular;  Laterality: Right;  VEIN REPAIR Right 02/12/2019  Procedure: REPAIR RIGHT JUGULAR VEIN;  Surgeon: Larina EarthlyEarly, Todd F, MD;  [facility];  Service: Vascular;  Laterality: Right; HPI: Pt is a 78 y/o male s/p removal of central line with repair of R common carotid and internal jugular on 7/1. PMH: PNA (multiple times per pt, as recently as the past year),  atrial fibrillation, HTN, COPD, HLD, CAD, alcohol abuse, and seizures  Subjective: pt alert, says he has some swallowing trouble at home but it is mild Assessment / Plan / Recommendation CHL IP CLINICAL IMPRESSIONS 02/14/2019 Clinical Impression Pt has a mild-moderate oropharyngeal dysphagia with suspected esophageal dysphagia as well. He has premature spillage of thin liquids as well as reduced base of tongue retraction, pharyngeal squeeze, and hyolaryngeal movement,  with reduced epiglottic inversion. As a result, there is penetration and residue primarily in the valleculae with all consistencies. Thin liquids residuals do fall further to sit in the pyriform sinuses as well. Penetration is transient in nature, even when larger boluses of thin liquids reach all the way to the vocal folds before being ejected. There were two times in which he did not fully clear penetrates upon completion of the swallow, but both times he cleared it with a spontaneous throat clear. Coughing was noted intermittently throughout the study, as was a wet vocal quality, but with no correlation to airway compromise.  Upon a brief esophageal scan, there appeared to be a column of barium above the barium tablet, which took several minutes to enter the stomach (MD not present for confirmation). He had retrograde flow of the barium that remained  in the esophagus but did approach re-entering the pharynx. Recommend Dys 2 diet to facilitate oral preparation as he doesn't have his dentures, but also to ensure adequate mastication in light of concern for esophageal issues. Thin liquids would be appropriate, but overall would recommend using aspiration and esophageal precautions. SLP will f/u for tolerance and implementation of strategies, including use of a second swallow to reduce residue. SLP Visit Diagnosis Dysphagia, pharyngoesophageal phase (R13.14) Attention and concentration deficit following -- Frontal lobe and executive function deficit following -- Impact on safety and function Mild aspiration risk;Moderate aspiration risk   CHL IP TREATMENT RECOMMENDATION 02/14/2019 Treatment Recommendations Therapy as outlined in treatment plan below   Prognosis 02/14/2019 Prognosis for Safe Diet Advancement Good Barriers to Reach Goals Time post onset Barriers/Prognosis Comment -- CHL IP DIET RECOMMENDATION 02/14/2019 SLP Diet Recommendations Dysphagia 2 (Fine chop) solids;Thin liquid Liquid Administration via Cup;Straw Medication Administration Crushed with puree Compensations Slow rate;Small sips/bites;Multiple dry swallows after each bite/sip;Follow solids with liquid Postural Changes Seated upright at 90 degrees;Remain semi-upright after after feeds/meals (Comment)   CHL IP OTHER RECOMMENDATIONS 02/14/2019 Recommended Consults -- Oral Care Recommendations Oral care BID Other Recommendations --   CHL IP FOLLOW UP RECOMMENDATIONS 02/14/2019 Follow up Recommendations Skilled Nursing facility   [facility] IP FREQUENCY AND DURATION 02/14/2019 Speech Therapy Frequency (ACUTE ONLY) min 2x/week Treatment Duration 2 weeks      CHL IP ORAL PHASE 02/14/2019 Oral Phase Impaired Oral - Pudding Teaspoon -- Oral - Pudding Cup -- Oral - Honey Teaspoon -- Oral - Honey Cup -- Oral - Nectar Teaspoon -- Oral - Nectar Cup -- Oral - Nectar Straw -- Oral - Thin Teaspoon -- Oral - Thin Cup Premature  spillage Oral - Thin Straw Premature spillage Oral - Puree WFL Oral - Mech Soft WFL Oral - Regular -- Oral - Multi-Consistency -- Oral - Pill WFL Oral Phase - Comment --  CHL IP PHARYNGEAL PHASE 02/14/2019 Pharyngeal Phase Impaired Pharyngeal- Pudding Teaspoon -- Pharyngeal -- Pharyngeal- Pudding Cup -- Pharyngeal -- Pharyngeal- Honey Teaspoon -- Pharyngeal -- Pharyngeal- Honey Cup -- Pharyngeal -- Pharyngeal- Nectar Teaspoon -- Pharyngeal -- Pharyngeal- Nectar Cup -- Pharyngeal -- Pharyngeal- Nectar Straw -- Pharyngeal -- Pharyngeal- Thin Teaspoon -- Pharyngeal -- Pharyngeal- Thin Cup Reduced pharyngeal peristalsis;Reduced epiglottic inversion;Reduced anterior laryngeal mobility;Reduced laryngeal elevation;Reduced tongue base retraction;Pharyngeal residue - valleculae;Pharyngeal residue - pyriform;Penetration/Aspiration during swallow Pharyngeal Material enters airway, remains ABOVE vocal cords then ejected out Pharyngeal- Thin Straw Reduced pharyngeal peristalsis;Reduced epiglottic inversion;Reduced anterior laryngeal mobility;Reduced laryngeal elevation;Reduced tongue base retraction;Pharyngeal residue - valleculae;Pharyngeal residue - pyriform;Penetration/Aspiration during swallow Pharyngeal Material enters airway, CONTACTS cords and then ejected out Pharyngeal- Puree Reduced  pharyngeal peristalsis;Reduced epiglottic inversion;Reduced anterior laryngeal mobility;Reduced laryngeal elevation;Reduced tongue base retraction;Pharyngeal residue - valleculae;Penetration/Aspiration during swallow Pharyngeal Material enters airway, remains ABOVE vocal cords then ejected out Pharyngeal- Mechanical Soft Reduced pharyngeal peristalsis;Reduced epiglottic inversion;Reduced anterior laryngeal mobility;Reduced laryngeal elevation;Reduced tongue base retraction;Pharyngeal residue - valleculae;Penetration/Aspiration during swallow Pharyngeal Material enters airway, remains ABOVE vocal cords then ejected out Pharyngeal- Regular --  Pharyngeal -- Pharyngeal- Multi-consistency -- Pharyngeal -- Pharyngeal- Pill Reduced pharyngeal peristalsis;Reduced epiglottic inversion;Reduced anterior laryngeal mobility;Reduced laryngeal elevation;Reduced tongue base retraction Pharyngeal -- Pharyngeal Comment --  CHL IP CERVICAL ESOPHAGEAL PHASE 02/14/2019 Cervical Esophageal Phase Impaired Pudding Teaspoon -- Pudding Cup -- Honey Teaspoon -- Honey Cup -- Nectar Teaspoon -- Nectar Cup -- Nectar Straw -- Thin Teaspoon -- Thin Cup -- Thin Straw -- Puree -- Mechanical Soft -- Regular -- Multi-consistency -- Pill -- Cervical Esophageal Comment -- Virl AxeLaura P Nix 02/14/2019, 3:20 PM  Ivar DrapeLaura Nix, M.A. CCC-SLP Acute Rehabilitation Services Pager [phone] Office [phone]                  Scheduled Meds:  donepezil  5 mg Oral Daily   FLUoxetine  20 mg Oral Daily   folic acid  1 mg Oral Daily   guaiFENesin  600 mg Oral BID   LORazepam  0-4 mg Intravenous Q12H   metoprolol tartrate  25 mg Oral BID   mirtazapine  15 mg Oral QHS   mometasone-formoterol  2 puff Inhalation BID   montelukast  10 mg Oral Daily   multivitamin with minerals  1 tablet Oral Daily   pantoprazole  40 mg Oral Daily   pravastatin  40 mg Oral Daily   predniSONE  20 mg Oral Q breakfast   rivaroxaban  20 mg Oral Q supper   roflumilast  500 mcg Oral Daily   senna-docusate  2 tablet Oral QHS   sodium chloride flush  3 mL Intravenous Q12H   tamsulosin  0.4 mg Oral Daily   thiamine  100 mg Oral Daily   Or   thiamine  100 mg Intravenous Daily   Continuous Infusions:  dextrose 5 % and 0.9% NaCl 60 mL/hr at 02/15/19 0931     LOS: 3 days    Time spent: 35 mins. More than 50% of that time was spent in counseling and/or coordination of care.      Burnadette PopAmrit Jillien Yakel, MD [facility] Pager [phone]  If 7PM-7AM, please contact night-coverage [email or web address] Password [email or web address] 02/15/2019, 11:35 AM

## 2019-02-16 LAB — SARS CORONAVIRUS 2 BY RT PCR (HOSPITAL ORDER, PERFORMED IN ~~LOC~~ HOSPITAL LAB): SARS Coronavirus 2: NEGATIVE

## 2019-02-16 MED ORDER — POTASSIUM CHLORIDE CRYS ER 20 MEQ PO TBCR
40.0000 meq | EXTENDED_RELEASE_TABLET | Freq: Once | ORAL | Status: AC
  Administered 2019-02-16: 40 meq via ORAL
  Filled 2019-02-16: qty 2

## 2019-02-16 NOTE — TOC Transition Note (Signed)
Transition of Care [facility]) - CM/SW Discharge Note   Patient Details  Name: SMILEY BIRR MRN: [id number] Date of Birth: 1940-11-18  Transition of Care [facility]) CM/SW Contact:  Bary Castilla, LCSW Phone Number: [phone] 02/16/2019, 4:34 PM   Clinical Narrative:     CSW completed Fl2 and faxed out to [facility]. CSW spoke with Broadus John at [facility] and he stated that he would work on [employer] authorization and they could accept him once the authorization comes back. CSW notified RN that another COVID test is needed. Broadus John stated that they may be able to accept the one that was completed if new test is not back.  CSW attempted to reach out niece Tammy and was unable to get in contact with her.  CSW will continue to follow up with discharge planning.       Barriers to Discharge: Continued Medical Work up   Patient Goals and CMS Choice Patient states their goals for this hospitalization and ongoing recovery are:: patient to get better before going home      Discharge Placement                       Discharge Plan and Services In-house Referral: Clinical Social Work                                   Social Determinants of Health (SDOH) Interventions     Readmission Risk Interventions No flowsheet data found.

## 2019-02-16 NOTE — Progress Notes (Signed)
PROGRESS NOTE    Marc Morris  WUJ:[id number] DOB: 1941/07/04 DOA: 02/12/2019 PCP: Shelbie AmmonsHaque, Imran P, MD   Brief Narrative:  Patient is a 78 year old male with history of paroxysmal A. fib, hypertension, COPD, hyperlipidemia, coronary artery disease, alcohol abuse, seizures who was [facility] on 02/12/2019 after accidentally cannulization of the right carotid artery internal jugular during placement of a central line. He is s/p removal of central line with repair of R common carotid and internal jugularon Dr Arbie CookeyEarly on 02/12/19.Now awaiting SNF placement.SW following.   He is hemodynamically stable for discharge to SNF as soon as bed is available.  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  Procedure done on 02/12/2019 by vascular surgery.  No hematoma.  Paroxysmal A. fib: Currently stable.  Heart rate controlled.  Continue Xarelto.  On metoprolol.  Echocardiogram showed ejection fraction of 55 to 60% and suggestive of diastolic CHF.  History of chronic alcohol abuse: Not in withdrawal at present.  Continue thiamine folic acid.  Continue CIWA protocol. Patient has mild elevated liver enzymes secondary to chronic alcohol abuse.  COPD/tobacco abuse: Smoking cessation advised.  Continue bronchodilators as needed.  Not in acute exacerbation  Depression/anxiety: Continue Remeron and fluoxetine.  Currently stable.  Continue Ativan for anxiety  Dysphagia: Patient seen by speech.  Modified barium swallow done.  Recommended dysphagia 2 diet.  Hypokalemia:  Supplemented with potassium.  Debility/deconditioning: Patient recommended by physical therapy for skilled nursing facility.  Social worker consulted.  Called Child psychotherapistsocial worker , call not received.  Patient is hemodynamically stable for discharge.          DVT prophylaxis: xarelto Code Status: Full Family Communication: None pending at the bedside Disposition Plan: Skilled nursing facility as soon as the bed is available.   Consultants: Vascular surgery  Procedures: Removal of central line  Antimicrobials:  Anti-infectives (From admission, onward)   Start     Dose/Rate Route Frequency Ordered Stop   02/13/19 0000  ceFAZolin (ANCEF) IVPB 2g/100 mL premix     2 g 200 mL/hr over 30 Minutes Intravenous Every 8 hours 02/12/19 1845 02/13/19 0841      Subjective:  Patient seen and examined the bedside this morning.  Hemodynamically stable.  Alert and oriented.  Denies any complaints. No active issues overnight.  Objective: Vitals:   02/16/19 0423 02/16/19 0745 02/16/19 0846 02/16/19 1020  BP: 140/66 (!) 153/77  (!) 153/77  Pulse: 65 65 84 84  Resp: 16 18    Temp: 98.2 F (36.8 C) (!) 97.4 F (36.3 C)    TempSrc: Oral Oral    SpO2: 100% 100%    Weight:      Height:        Intake/Output Summary (Last 24 hours) at 02/16/2019 1345 Last data filed at 02/16/2019 0840 Gross per 24 hour  Intake 180 ml  Output 975 ml  Net -795 ml   Filed Weights   02/12/19 1858  Weight: 51.8 kg    Examination:  General exam: Appears calm and comfortable ,Not in distress,average built Respiratory system: Bilateral  equal air entry, normal vesicular breath sounds, no wheezes or crackles  Cardiovascular system: S1 & S2 heard, RRR. No JVD, murmurs, rubs, gallops or clicks. Gastrointestinal system: Abdomen is nondistended, soft and nontender. No organomegaly or masses felt. Normal bowel sounds heard. Central nervous system: Alert and oriented. No focal neurological deficits. Extremities: No  edema, no clubbing ,no cyanosis, distal peripheral pulses palpable. Skin: No rashes, lesions or ulcers,no icterus ,no pallor   Data Reviewed: I have personally reviewed following labs and imaging studies  CBC: Recent Labs  Lab 02/12/19 2048 02/13/19 0320  WBC 7.3 7.1  HGB 14.6 13.9  HCT 41.1 39.2  MCV 86.2 86.3  PLT 233 244   Basic Metabolic Panel: Recent Labs  Lab 02/12/19 2048 02/13/19 0320 02/15/19 0147  NA 129* 130* 138  K 6.4* 5.1 3.4*  CL 90* 91* 103  CO2 22 22 26   GLUCOSE 87 86 146*  BUN 6* 8 6*  CREATININE 0.83 0.76 0.62  CALCIUM 8.6* 8.9 8.5*   GFR: Estimated Creatinine Clearance: 58.5 mL/min (by C-G formula based on SCr of 0.62 mg/dL). Liver Function Tests: Recent Labs  Lab 02/15/19 0147  AST 102*  ALT 60*  ALKPHOS 59  BILITOT 0.7  PROT 5.2*  ALBUMIN 2.7*   No results for input(s): LIPASE, AMYLASE in the last 168 hours. No results for input(s): AMMONIA in the last 168 hours. Coagulation Profile: No results for input(s): INR, PROTIME in the last 168 hours. Cardiac Enzymes: No results for input(s): CKTOTAL, CKMB, CKMBINDEX, TROPONINI in the last 168 hours. BNP (last 3 results) No results for input(s): PROBNP in the last 8760 hours. HbA1C: No results for input(s): HGBA1C in the last 72 hours. CBG: Recent Labs  Lab 02/12/19 1551  GLUCAP 102*   Lipid Profile: No results for input(s): CHOL, HDL, LDLCALC, TRIG, CHOLHDL, LDLDIRECT in the last 72 hours. Thyroid Function Tests: No results for input(s): TSH, T4TOTAL, FREET4, T3FREE, THYROIDAB in the last 72 hours. Anemia Panel: No results for input(s): VITAMINB12, FOLATE, FERRITIN, TIBC, IRON, RETICCTPCT in the last 72 hours. Sepsis Labs: No results for input(s): PROCALCITON, LATICACIDVEN in the last 168 hours.  Recent Results (from the past 240 hour(s))  SARS Coronavirus 2 (CEPHEID - Performed in [facility] hospital lab), Hosp Order     Status: None   Collection Time: 02/13/19  1:25 AM    Specimen: Nasopharyngeal Swab  Result Value Ref Range Status   SARS Coronavirus 2 NEGATIVE NEGATIVE Final    Comment: (NOTE) If result is NEGATIVE SARS-CoV-2 target nucleic acids are NOT DETECTED. The SARS-CoV-2 RNA is generally detectable in upper and lower  respiratory specimens during the acute phase of infection. The lowest  concentration of SARS-CoV-2 viral copies this assay can detect is 250  copies / mL. A negative result does not preclude SARS-CoV-2 infection  and should not be used as the sole basis for treatment or other  patient management decisions.  A negative result may occur with  improper specimen collection / handling, submission of specimen other  than nasopharyngeal swab, presence of viral mutation(s) within the  areas targeted by this assay, and inadequate number of viral copies  (<250 copies / mL). A negative result must be combined with clinical  observations, patient history, and epidemiological information. If result is POSITIVE SARS-CoV-2 target nucleic acids are DETECTED. The SARS-CoV-2 RNA is generally detectable in upper and lower  respiratory specimens dur ing the acute phase of infection.  Positive  results are indicative of active  infection with SARS-CoV-2.  Clinical  correlation with patient history and other diagnostic information is  necessary to determine patient infection status.  Positive results do  not rule out bacterial infection or co-infection with other viruses. If result is PRESUMPTIVE POSTIVE SARS-CoV-2 nucleic acids MAY BE PRESENT.   A presumptive positive result was obtained on the submitted specimen  and confirmed on repeat testing.  While 2019 novel coronavirus  (SARS-CoV-2) nucleic acids may be present in the submitted sample  additional confirmatory testing may be necessary for epidemiological  and / or clinical management purposes  to differentiate between  SARS-CoV-2 and other Sarbecovirus currently known to infect humans.  If  clinically indicated additional testing with an alternate test  methodology [phone]([id number]) is advised. The SARS-CoV-2 RNA is generally  detectable in upper and lower respiratory sp ecimens during the acute  phase of infection. The expected result is Negative. Fact Sheet for Patients:  BoilerBrush.com.cyhttps://www.fda.gov/media/136312/download Fact Sheet for Healthcare Providers: https://pope.com/https://www.fda.gov/media/136313/download This test is not yet approved or cleared by the [place] FDA and has been authorized for detection and/or diagnosis of SARS-CoV-2 by FDA under an Emergency Use Authorization (EUA).  This EUA will remain in effect (meaning this test can be used) for the duration of the COVID-19 declaration under Section 564(b)(1) of the Act, 21 U.S.C. section 360bbb-3(b)(1), unless the authorization is terminated or revoked sooner. Performed at [facility], [address] [place], [place] [address]          Radiology Studies: Dg Swallowing Func-speech Pathology  Result Date: 02/14/2019 Objective Swallowing Evaluation: Type of Study: MBS-Modified Barium Swallow Study  Patient Details Name: Marc Morris MRN: [id number] Date of Birth: [birth year] Today's Date: 02/14/2019 Time: SLP Start Time (ACUTE ONLY): 1406 -SLP Stop Time (ACUTE ONLY): 1428 SLP Time Calculation (min) (ACUTE ONLY): 22 min Past Medical History: Past Medical History: Diagnosis Date  COPD (chronic obstructive pulmonary disease) (HCC)   Headache(784.0)   Hematuria - cause not known 2012  [place], did not find a cause.  Hypertension   Pneumonia 2012  [facility]  Seizures [facility](HCC)   age 78- was dinking alot of alchol  Shortness of breath   after standing, then it "gets all right"  Vision loss   "needs glasses" Past Surgical History: Past Surgical History: Procedure Laterality Date  CYSTOSCOPY  2012  ENDARTERECTOMY Right 02/12/2019  Procedure: Right neck exploration and removal of carotid  central line.;  Surgeon: Larina EarthlyEarly, Todd F, MD;  [facility];  Service: Vascular;  Laterality: Right;  VEIN REPAIR Right 02/12/2019  Procedure: REPAIR RIGHT JUGULAR VEIN;  Surgeon: Larina EarthlyEarly, Todd F, MD;  [facility];  Service: Vascular;  Laterality: Right; HPI: Pt is a 78 y/o male s/p removal of central line with repair of R common carotid and internal jugular on 7/1. PMH: PNA (multiple times per pt, as recently as the past year),  atrial fibrillation, HTN, COPD, HLD, CAD, alcohol abuse, and seizures  Subjective: pt alert, says he has some swallowing trouble at home but it is mild Assessment / Plan / Recommendation CHL IP CLINICAL IMPRESSIONS 02/14/2019 Clinical Impression Pt has a mild-moderate oropharyngeal dysphagia with suspected esophageal dysphagia as well. He has premature spillage of thin liquids as well as reduced base of tongue retraction, pharyngeal squeeze, and hyolaryngeal movement, with reduced epiglottic inversion. As a result, there is penetration and residue primarily in the valleculae with all consistencies. Thin liquids residuals do fall further to sit in  the pyriform sinuses as well. Penetration is transient in nature, even when larger boluses of thin liquids reach all the way to the vocal folds before being ejected. There were two times in which he did not fully clear penetrates upon completion of the swallow, but both times he cleared it with a spontaneous throat clear. Coughing was noted intermittently throughout the study, as was a wet vocal quality, but with no correlation to airway compromise.  Upon a brief esophageal scan, there appeared to be a column of barium above the barium tablet, which took several minutes to enter the stomach (MD not present for confirmation). He had retrograde flow of the barium that remained in the esophagus but did approach re-entering the pharynx. Recommend Dys 2 diet to facilitate oral preparation as he doesn't have his dentures, but also to ensure adequate  mastication in light of concern for esophageal issues. Thin liquids would be appropriate, but overall would recommend using aspiration and esophageal precautions. SLP will f/u for tolerance and implementation of strategies, including use of a second swallow to reduce residue. SLP Visit Diagnosis Dysphagia, pharyngoesophageal phase (R13.14) Attention and concentration deficit following -- Frontal lobe and executive function deficit following -- Impact on safety and function Mild aspiration risk;Moderate aspiration risk   CHL IP TREATMENT RECOMMENDATION 02/14/2019 Treatment Recommendations Therapy as outlined in treatment plan below   Prognosis 02/14/2019 Prognosis for Safe Diet Advancement Good Barriers to Reach Goals Time post onset Barriers/Prognosis Comment -- CHL IP DIET RECOMMENDATION 02/14/2019 SLP Diet Recommendations Dysphagia 2 (Fine chop) solids;Thin liquid Liquid Administration via Cup;Straw Medication Administration Crushed with puree Compensations Slow rate;Small sips/bites;Multiple dry swallows after each bite/sip;Follow solids with liquid Postural Changes Seated upright at 90 degrees;Remain semi-upright after after feeds/meals (Comment)   CHL IP OTHER RECOMMENDATIONS 02/14/2019 Recommended Consults -- Oral Care Recommendations Oral care BID Other Recommendations --   CHL IP FOLLOW UP RECOMMENDATIONS 02/14/2019 Follow up Recommendations Skilled Nursing facility   [facility] IP FREQUENCY AND DURATION 02/14/2019 Speech Therapy Frequency (ACUTE ONLY) min 2x/week Treatment Duration 2 weeks      CHL IP ORAL PHASE 02/14/2019 Oral Phase Impaired Oral - Pudding Teaspoon -- Oral - Pudding Cup -- Oral - Honey Teaspoon -- Oral - Honey Cup -- Oral - Nectar Teaspoon -- Oral - Nectar Cup -- Oral - Nectar Straw -- Oral - Thin Teaspoon -- Oral - Thin Cup Premature spillage Oral - Thin Straw Premature spillage Oral - Puree WFL Oral - Mech Soft WFL Oral - Regular -- Oral - Multi-Consistency -- Oral - Pill WFL Oral Phase - Comment --  CHL IP  PHARYNGEAL PHASE 02/14/2019 Pharyngeal Phase Impaired Pharyngeal- Pudding Teaspoon -- Pharyngeal -- Pharyngeal- Pudding Cup -- Pharyngeal -- Pharyngeal- Honey Teaspoon -- Pharyngeal -- Pharyngeal- Honey Cup -- Pharyngeal -- Pharyngeal- Nectar Teaspoon -- Pharyngeal -- Pharyngeal- Nectar Cup -- Pharyngeal -- Pharyngeal- Nectar Straw -- Pharyngeal -- Pharyngeal- Thin Teaspoon -- Pharyngeal -- Pharyngeal- Thin Cup Reduced pharyngeal peristalsis;Reduced epiglottic inversion;Reduced anterior laryngeal mobility;Reduced laryngeal elevation;Reduced tongue base retraction;Pharyngeal residue - valleculae;Pharyngeal residue - pyriform;Penetration/Aspiration during swallow Pharyngeal Material enters airway, remains ABOVE vocal cords then ejected out Pharyngeal- Thin Straw Reduced pharyngeal peristalsis;Reduced epiglottic inversion;Reduced anterior laryngeal mobility;Reduced laryngeal elevation;Reduced tongue base retraction;Pharyngeal residue - valleculae;Pharyngeal residue - pyriform;Penetration/Aspiration during swallow Pharyngeal Material enters airway, CONTACTS cords and then ejected out Pharyngeal- Puree Reduced pharyngeal peristalsis;Reduced epiglottic inversion;Reduced anterior laryngeal mobility;Reduced laryngeal elevation;Reduced tongue base retraction;Pharyngeal residue - valleculae;Penetration/Aspiration during swallow Pharyngeal Material enters airway, remains ABOVE vocal cords then ejected out  Pharyngeal- Mechanical Soft Reduced pharyngeal peristalsis;Reduced epiglottic inversion;Reduced anterior laryngeal mobility;Reduced laryngeal elevation;Reduced tongue base retraction;Pharyngeal residue - valleculae;Penetration/Aspiration during swallow Pharyngeal Material enters airway, remains ABOVE vocal cords then ejected out Pharyngeal- Regular -- Pharyngeal -- Pharyngeal- Multi-consistency -- Pharyngeal -- Pharyngeal- Pill Reduced pharyngeal peristalsis;Reduced epiglottic inversion;Reduced anterior laryngeal  mobility;Reduced laryngeal elevation;Reduced tongue base retraction Pharyngeal -- Pharyngeal Comment --  CHL IP CERVICAL ESOPHAGEAL PHASE 02/14/2019 Cervical Esophageal Phase Impaired Pudding Teaspoon -- Pudding Cup -- Honey Teaspoon -- Honey Cup -- Nectar Teaspoon -- Nectar Cup -- Nectar Straw -- Thin Teaspoon -- Thin Cup -- Thin Straw -- Puree -- Mechanical Soft -- Regular -- Multi-consistency -- Pill -- Cervical Esophageal Comment -- Venita Sheffield Nix 02/14/2019, 3:20 PM  Pollyann Glen, M.A. CCC-SLP Acute Rehabilitation Services Pager [phone] Office [phone]                  Scheduled Meds:  donepezil  5 mg Oral Daily   FLUoxetine  20 mg Oral Daily   folic acid  1 mg Oral Daily   guaiFENesin  600 mg Oral BID   LORazepam  0-4 mg Intravenous Q12H   metoprolol tartrate  25 mg Oral BID   mirtazapine  15 mg Oral QHS   mometasone-formoterol  2 puff Inhalation BID   montelukast  10 mg Oral Daily   multivitamin with minerals  1 tablet Oral Daily   pantoprazole  40 mg Oral Daily   pravastatin  40 mg Oral Daily   rivaroxaban  20 mg Oral Q supper   roflumilast  500 mcg Oral Daily   senna-docusate  2 tablet Oral QHS   sodium chloride flush  3 mL Intravenous Q12H   tamsulosin  0.4 mg Oral Daily   thiamine  100 mg Oral Daily   Or   thiamine  100 mg Intravenous Daily   Continuous Infusions:    LOS: 4 days    Time spent: 35 mins. More than 50% of that time was spent in counseling and/or coordination of care.      Shelly Coss, MD [facility] Pager [phone]  If 7PM-7AM, please contact night-coverage [email or web address] Password [email or web address] 02/16/2019, 1:45 PM

## 2019-02-16 NOTE — NC FL2 (Signed)
[place] LEVEL OF CARE SCREENING TOOL     IDENTIFICATION  Patient Name: Marc Morris Birthdate: 01/06/1941 Sex: male Admission Date (Current Location): 02/12/2019  [facility] and [place] Number:  Herbalist and Address:         Provider Number: [id number]  Attending Physician Name and Address:  Shelly Coss, MD  Relative Name and Phone Number:  [phone] [place]: Hospital Recommended Level of Care: [place] Prior Approval Number:    Date Approved/Denied:   PASRR Number: [id number]  Discharge Plan: SNF    Current Diagnoses: Patient Active Problem List   Diagnosis Date Noted  . Chronic respiratory failure with hypoxia -- 2L/min 02/13/2019  . 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 AF (paroxysmal atrial fibrillation) ([place]) 02/12/2019  . Essential hypertension 02/12/2019  . COPD (chronic obstructive pulmonary disease) ([place]) 02/12/2019  . History of alcohol abuse 02/12/2019  . Near syncope 02/12/2019  . Falls 02/12/2019    Orientation RESPIRATION BLADDER Height & Weight     Self, Time, Situation, Place  O2 Incontinent, External catheter Weight: 114 lb 3.2 oz (51.8 kg) Height:  5\' 6"  (167.6 cm)  BEHAVIORAL SYMPTOMS/MOOD NEUROLOGICAL BOWEL NUTRITION STATUS      Incontinent    AMBULATORY STATUS COMMUNICATION OF NEEDS Skin   Limited Assist Verbally Other (Comment)(dry, surgical incision)                       Personal Care Assistance Level of Assistance  Bathing, Feeding, Dressing Bathing Assistance: Independent Feeding assistance: Limited assistance Dressing Assistance: Limited assistance     Functional Limitations Info  Sight, Hearing, Speech Sight Info: Adequate Hearing Info: Impaired Speech Info: Impaired    SPECIAL CARE FACTORS FREQUENCY  PT (By licensed PT), OT (By licensed OT)     PT Frequency: 5x per week OT Frequency: 5x per week             Contractures Contractures Info: Not present    Additional Factors Info  Code Status Code Status Info: Full             Current Medications (02/16/2019):  This is the current hospital active medication list Current Facility-Administered Medications  Medication Dose Route Frequency Provider Last Rate Last Dose  . acetaminophen (TYLENOL) tablet 650 mg  650 mg Oral Q6H PRN Fuller Plan A, MD       Or  . acetaminophen (TYLENOL) suppository 650 mg  650 mg Rectal Q6H PRN Smith, Rondell A, MD      . albuterol (VENTOLIN HFA) 108 (90 Base) MCG/ACT inhaler 1 puff  1 puff Inhalation Q6H PRN Fuller Plan A, MD   1 puff at 02/13/19 0222  . ALPRAZolam Duanne Moron) tablet 1 mg  1 mg Oral BID PRN Fuller Plan A, MD   1 mg at 02/16/19 1018  . donepezil (ARICEPT) tablet 5 mg  5 mg Oral Daily Smith, Rondell A, MD   5 mg at 02/16/19 1021  . FLUoxetine (PROZAC) capsule 20 mg  20 mg Oral Daily Smith, Rondell A, MD   20 mg at 23/53/61 4431  . folic acid (FOLVITE) tablet 1 mg  1 mg Oral Daily Smith, Rondell A, MD   1 mg at 02/16/19 1021  . guaiFENesin (MUCINEX) 12 hr tablet 600 mg  600 mg Oral BID Emokpae, Courage, MD   600 mg at 02/16/19 1022  . hydrALAZINE (APRESOLINE)  injection 10 mg  10 mg Intravenous Q4H PRN Smith, Rondell A, MD      . Ipratropium-Albuterol (COMBIVENT) respimat 1 puff  1 puff Inhalation Q4H PRN Smith, Rondell A, MD      . LORazepam (ATIVAN) injection 0-4 mg  0-4 mg Intravenous Q12H Smith, Rondell A, MD      . metoprolol tartrate (LOPRESSOR) tablet 25 mg  25 mg Oral BID Katrinka BlazingSmith, Rondell A, MD   25 mg at 02/16/19 1020  . mirtazapine (REMERON) tablet 15 mg  15 mg Oral QHS Emokpae, Courage, MD   15 mg at 02/15/19 2123  . mometasone-formoterol (DULERA) 200-5 MCG/ACT inhaler 2 puff  2 puff Inhalation BID Madelyn FlavorsSmith, Rondell A, MD   2 puff at 02/16/19 0845  . montelukast (SINGULAIR) tablet 10 mg  10 mg Oral Daily Katrinka BlazingSmith, Rondell A, MD   10 mg at 02/16/19 1020  . morphine 2 MG/ML injection 2 mg   2 mg Intravenous Q3H PRN Rhyne, Samantha J, PA-C      . multivitamin with minerals tablet 1 tablet  1 tablet Oral Daily Madelyn FlavorsSmith, Rondell A, MD   1 tablet at 02/16/19 1022  . ondansetron (ZOFRAN) tablet 4 mg  4 mg Oral Q6H PRN Madelyn FlavorsSmith, Rondell A, MD       Or  . ondansetron (ZOFRAN) injection 4 mg  4 mg Intravenous Q6H PRN Madelyn FlavorsSmith, Rondell A, MD   4 mg at 02/16/19 1457  . oxyCODONE-acetaminophen (PERCOCET/ROXICET) 5-325 MG per tablet 1-2 tablet  1-2 tablet Oral Q4H PRN Rhyne, Samantha J, PA-C      . pantoprazole (PROTONIX) EC tablet 40 mg  40 mg Oral Daily Rhyne, Samantha J, PA-C   40 mg at 02/16/19 1021  . phenol (CHLORASEPTIC) mouth spray 1 spray  1 spray Mouth/Throat PRN Rhyne, Samantha J, PA-C      . pravastatin (PRAVACHOL) tablet 40 mg  40 mg Oral Daily Smith, Rondell A, MD   40 mg at 02/16/19 1022  . rivaroxaban (XARELTO) tablet 20 mg  20 mg Oral Q supper Emilie Rutterveland, Matthew, PA-C   20 mg at 02/15/19 1636  . roflumilast (DALIRESP) tablet 500 mcg  500 mcg Oral Daily Madelyn FlavorsSmith, Rondell A, MD   500 mcg at 02/16/19 1028  . senna-docusate (Senokot-S) tablet 2 tablet  2 tablet Oral QHS Shon HaleEmokpae, Courage, MD   2 tablet at 02/15/19 2123  . sodium chloride flush (NS) 0.9 % injection 3 mL  3 mL Intravenous Q12H Smith, Rondell A, MD   3 mL at 02/16/19 1027  . tamsulosin (FLOMAX) capsule 0.4 mg  0.4 mg Oral Daily Smith, Rondell A, MD   0.4 mg at 02/16/19 1019  . thiamine (VITAMIN B-1) tablet 100 mg  100 mg Oral Daily Katrinka BlazingSmith, Rondell A, MD   100 mg at 02/14/19 [id number]   Or  . thiamine (B-1) injection 100 mg  100 mg Intravenous Daily Madelyn FlavorsSmith, Rondell A, MD   100 mg at 02/16/19 1017     Discharge Medications: Please see discharge summary for a list of discharge medications.  Relevant Imaging Results:  Relevant Lab Results:   Additional Information [id number] [id number]  Patrice ParadiseLisa E Manfred [place], [place]

## 2019-02-17 LAB — BASIC METABOLIC PANEL
Anion gap: 9 (ref 5–15)
BUN: 12 mg/dL (ref 8–23)
CO2: 27 mmol/L (ref 22–32)
Calcium: 8.7 mg/dL — ABNORMAL LOW (ref 8.9–10.3)
Chloride: 100 mmol/L (ref 98–111)
Creatinine, Ser: 0.61 mg/dL (ref 0.61–1.24)
GFR calc Af Amer: >60 mL/min (ref >60)
GFR calc non Af Amer: >60 mL/min (ref >60)
Glucose, Bld: 132 mg/dL — ABNORMAL HIGH (ref 70–99)
Potassium: 3.5 mmol/L (ref 3.5–5.1)
Sodium: 136 mmol/L (ref 135–145)

## 2019-02-17 MED ORDER — ALPRAZOLAM 1 MG PO TABS: 1.0000 mg | ORAL_TABLET | Freq: Two times a day (BID) | ORAL | 0 refills | Status: DC | PRN

## 2019-02-17 MED ORDER — FOLIC ACID 1 MG PO TABS: 1.0000 mg | ORAL_TABLET | Freq: Every day | ORAL | Status: DC

## 2019-02-17 MED ORDER — THIAMINE HCL 100 MG PO TABS: 100.0000 mg | ORAL_TABLET | Freq: Every day | ORAL | Status: DC

## 2019-02-17 MED ORDER — LORAZEPAM 2 MG/ML IJ SOLN
1.0000 mg | Freq: Once | INTRAMUSCULAR | Status: AC
  Administered 2019-02-17: 1 mg via INTRAVENOUS
  Filled 2019-02-17: qty 1

## 2019-02-17 MED ORDER — LISINOPRIL 5 MG PO TABS: 5.0000 mg | ORAL_TABLET | Freq: Every day | ORAL | Status: DC

## 2019-02-17 NOTE — Progress Notes (Signed)
CSW received a phone call from [place] with [facility]. [employer] has denied the patient's stay. They are requesting a peer to peer.   The medical director with [facility] will call Dr. Tawanna Solo and complete the peer to peer.   CSW spoke with Manuela Schwartz, RN with [facility] and provided her phone number. Manuela Schwartz will call back once the peer to peer has been completed with insurance approval or denial.   CSW will continue to follow.   Domenic Schwab, MSW, [place]

## 2019-02-17 NOTE — Care Management Important Message (Signed)
Important Message  Patient Details  Name: Marc Morris MRN: [id number] Date of Birth: 01-24-1941   Medicare Important Message Given:  Yes     [name] 02/17/2019, 1:22 PM

## 2019-02-17 NOTE — Progress Notes (Signed)
  Speech Language Pathology Treatment: Dysphagia  Patient Details Name: Marc Morris MRN: [id number] DOB: [birth year] Today's Date: 02/17/2019 Time: 1779-3903 SLP Time Calculation (min) (ACUTE ONLY): 13 min  Assessment / Plan / Recommendation Clinical Impression  Pt consumed PO trials with Mod cues provided for use of second swallow to reduce pharyngeal residuals, which were noted on MBS. Pt's reduced hearing may also be contributing to the amount of cueing needed. He had one cough across PO intake, otherwise without any outward signs of aspiration, and per MBS, cough was not necessarily associated with poor airway protection. Pt said that his meal trays have been an appropriate consistency for oral preparation given that he does not have his dentures here. Recommend continuing current diet and precautions with f/u upon discharge at SNF.   HPI HPI: Pt is a 78 y/o male s/p removal of central line with repair of R common carotid and internal jugular on 7/1. PMH: PNA (multiple times per pt, as recently as the past year),  atrial fibrillation, HTN, COPD, HLD, CAD, alcohol abuse, and seizures      SLP Plan  Continue with current plan of care       Recommendations  Diet recommendations: Dysphagia 2 (fine chop);Thin liquid Liquids provided via: Cup;Straw Medication Administration: Crushed with puree Supervision: Patient able to self feed;Full supervision/cueing for compensatory strategies Compensations: Slow rate;Small sips/bites;Multiple dry swallows after each bite/sip;Follow solids with liquid Postural Changes and/or Swallow Maneuvers: Seated upright 90 degrees;Upright 30-60 min after meal                Oral Care Recommendations: Oral care BID Follow up Recommendations: Skilled Nursing facility SLP Visit Diagnosis: Dysphagia, pharyngoesophageal phase (R13.14) Plan: Continue with current plan of care       GO                Venita Sheffield Precilla Purnell 02/17/2019, 2:28 PM  Pollyann Glen,  M.A. [place] Acute Environmental education officer [phone] Office [phone]

## 2019-02-17 NOTE — Discharge Summary (Addendum)
Physician Discharge Summary  KALDEN WANKE ZOX:[id number] DOB: [birth year] DOA: 02/12/2019  PCP: Shelbie Ammons, MD  Admit date: 02/12/2019 Discharge date: 02/18/19 Admitted From: Home Disposition:  SNF  Discharge Condition:Stable CODE STATUS:FULL Diet recommendation: Dysphagia 2 diet  Brief/Interim Summary:  Patient is a 78 year old male with history of paroxysmal A. fib, hypertension, COPD, hyperlipidemia, coronary artery disease, alcohol abuse, seizures who was transferred from [facility] on 02/12/2019 after accidentally cannulization of the right carotid artery internal jugular during placement of a central line. He is s/p removal of central line with repair of R common carotid and internal jugularonDr Early on 02/12/19.PT evaluated him and recommended skilled nursing facility on discharge.  He is hemodynamically stable for discharge to skilled nursing facility today.  He will follow-up with vascular surgery as an outpatient.  Following problems were addressed during his hospitalization:  Status post removal of central line with repair of right common carotid and internal jugular artery: Currently stable.  Procedure done on 02/12/2019 by vascular surgery.  No hematoma.Follow up with vascular surgery as an outpatient  Paroxysmal A. fib: Currently stable.  Heart rate controlled.  Continue Xarelto.  On metoprolol.  Echocardiogram showed ejection fraction of 55 to 60% and suggestive of diastolic CHF.  History of chronic alcohol abuse: Not in withdrawal at present.  Continue thiamine, folic acid.   Patient has mild elevated liver enzymes secondary to chronic alcohol abuse.  COPD/tobacco abuse: Smoking cessation advised.  Continue bronchodilators as needed.  Not in acute exacerbation  Depression/anxiety: Continue Remeron and fluoxetine.  Currently stable.  Continue Ativan for anxiety  Dysphagia: Patient seen by speech.  Modified barium swallow done.  Recommended dysphagia 2  diet.  Hypokalemia: Supplemented with potassium.  Debility/deconditioning: Patient recommended by physical therapy for skilled nursing facility.     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  Name and number the provider has been attached.  you will be called for appointment. 2)Take prescribed medications as instructed. 3)Do a CBC and BMP tests in a week.   Increase activity slowly   Complete by: As directed      Allergies as of 02/17/2019   No Known Allergies     Medication List    STOP taking these medications   Entresto 24-26 MG Generic drug: sacubitril-valsartan   predniSONE 5 MG tablet Commonly known as: DELTASONE     TAKE these medications   ALPRAZolam 1 MG tablet Commonly known as: XANAX Take 1 tablet (1 mg total) by mouth 2 (two) times daily as needed. For anxiety   Daliresp 500 MCG Tabs tablet Generic drug: roflumilast Take 500 mcg by mouth daily.   Dexilant 60 MG capsule Generic drug: dexlansoprazole Take 60 mg by mouth daily.   donepezil 5 MG tablet Commonly known as: ARICEPT Take 5 mg by mouth daily.   FLUoxetine 20 MG capsule Commonly known as: PROZAC Take 20 mg  by mouth daily.   folic acid 1 MG tablet Commonly known as: FOLVITE Take 1 tablet (1 mg total) by mouth daily. Start taking on: February 18, 2019   ipratropium-albuterol 0.5-2.5 (3) MG/3ML Soln Commonly known as: DUONEB Take 3 mLs by  nebulization every 4 (four) hours as needed (shortness of breath).   lisinopril 5 MG tablet Commonly known as: ZESTRIL Take 1 tablet (5 mg total) by mouth daily. May take a half tab if blood pressure is too low What changed:   medication strength  how much to take  when to take this  reasons to take this   metoprolol tartrate 50 MG tablet Commonly known as: LOPRESSOR Take 25 mg by mouth 2 (two) times daily.   mirtazapine 15 MG tablet Commonly known as: REMERON Take 15 mg by mouth daily.   montelukast 10 MG tablet Commonly known as: SINGULAIR Take 10 mg by mouth daily.   pravastatin 40 MG tablet Commonly known as: PRAVACHOL Take 40 mg by mouth daily.   ProAir HFA 108 (90 Base) MCG/ACT inhaler Generic drug: albuterol Inhale 2 puffs into the lungs as needed for wheezing or shortness of breath.   Symbicort 160-4.5 MCG/ACT inhaler Generic drug: budesonide-formoterol Inhale 1 puff into the lungs daily.   tamsulosin 0.4 MG Caps capsule Commonly known as: FLOMAX Take 0.4 mg by mouth daily.   thiamine 100 MG tablet Take 1 tablet (100 mg total) by mouth daily. Start taking on: February 18, 2019   Xarelto 20 MG Tabs tablet Generic drug: rivaroxaban Take 20 mg by mouth daily.      Follow-up Information    Early, Kristen Loaderodd F, MD In 2 weeks.   Specialties: Vascular Surgery, Cardiology Why: Office will call you to arrange your appt (sent) Contact information: [address] [place] [address] [phone]          No Known Allergies  Consultations:  Vascular surgery   Procedures/Studies: Dg Swallowing Func-speech Pathology  Result Date: 02/14/2019 Objective Swallowing Evaluation: Type of Study: MBS-Modified Barium Swallow Study  Patient Details Name: Marc LighterJames J Lagman MRN: [id number] Date of Birth: 09/18/1940 Today's Date: 02/14/2019 Time: SLP Start Time (ACUTE ONLY): 1406 -SLP Stop Time (ACUTE ONLY): 1428 SLP Time Calculation (min) (ACUTE ONLY): 22 min Past Medical  History: Past Medical History: Diagnosis Date . COPD (chronic obstructive pulmonary disease) (HCC)  . Headache(784.0)  . Hematuria - cause not known 2012  [place], did not find a cause. Marland Kitchen. Hypertension  . Pneumonia 2012  [facility] . Seizures [facility](HCC)   age 78- was dinking alot of alchol . Shortness of breath   after standing, then it "gets all right" . Vision loss   "needs glasses" Past Surgical History: Past Surgical History: Procedure Laterality Date . CYSTOSCOPY  2012 . ENDARTERECTOMY Right 02/12/2019  Procedure: Right neck exploration and removal of carotid central line.;  Surgeon: Larina EarthlyEarly, Todd F, MD;  [facility];  Service: Vascular;  Laterality: Right; . VEIN REPAIR Right 02/12/2019  Procedure: REPAIR RIGHT JUGULAR VEIN;  Surgeon: Larina EarthlyEarly, Todd F, MD;  [facility];  Service: Vascular;  Laterality: Right; HPI: Pt is a 78 y/o male s/p removal of central line with repair of R common carotid and internal jugular on 7/1. PMH: PNA (multiple times per pt, as recently as the past year),  atrial fibrillation, HTN, COPD, HLD, CAD, alcohol abuse, and seizures  Subjective: pt alert, says he has some swallowing trouble at home but it is  mild Assessment / Plan / Recommendation CHL IP CLINICAL IMPRESSIONS 02/14/2019 Clinical Impression Pt has a mild-moderate oropharyngeal dysphagia with suspected esophageal dysphagia as well. He has premature spillage of thin liquids as well as reduced base of tongue retraction, pharyngeal squeeze, and hyolaryngeal movement, with reduced epiglottic inversion. As a result, there is penetration and residue primarily in the valleculae with all consistencies. Thin liquids residuals do fall further to sit in the pyriform sinuses as well. Penetration is transient in nature, even when larger boluses of thin liquids reach all the way to the vocal folds before being ejected. There were two times in which he did not fully clear penetrates upon completion of  the swallow, but both times he cleared it with a spontaneous throat clear. Coughing was noted intermittently throughout the study, as was a wet vocal quality, but with no correlation to airway compromise.  Upon a brief esophageal scan, there appeared to be a column of barium above the barium tablet, which took several minutes to enter the stomach (MD not present for confirmation). He had retrograde flow of the barium that remained in the esophagus but did approach re-entering the pharynx. Recommend Dys 2 diet to facilitate oral preparation as he doesn't have his dentures, but also to ensure adequate mastication in light of concern for esophageal issues. Thin liquids would be appropriate, but overall would recommend using aspiration and esophageal precautions. SLP will f/u for tolerance and implementation of strategies, including use of a second swallow to reduce residue. SLP Visit Diagnosis Dysphagia, pharyngoesophageal phase (R13.14) Attention and concentration deficit following -- Frontal lobe and executive function deficit following -- Impact on safety and function Mild aspiration risk;Moderate aspiration risk   CHL IP TREATMENT RECOMMENDATION 02/14/2019 Treatment Recommendations Therapy as outlined in treatment plan below   Prognosis 02/14/2019 Prognosis for Safe Diet Advancement Good Barriers to Reach Goals Time post onset Barriers/Prognosis Comment -- CHL IP DIET RECOMMENDATION 02/14/2019 SLP Diet Recommendations Dysphagia 2 (Fine chop) solids;Thin liquid Liquid Administration via Cup;Straw Medication Administration Crushed with puree Compensations Slow rate;Small sips/bites;Multiple dry swallows after each bite/sip;Follow solids with liquid Postural Changes Seated upright at 90 degrees;Remain semi-upright after after feeds/meals (Comment)   CHL IP OTHER RECOMMENDATIONS 02/14/2019 Recommended Consults -- Oral Care Recommendations Oral care BID Other Recommendations --   CHL IP FOLLOW UP RECOMMENDATIONS 02/14/2019 Follow  up Recommendations [facility] D[facility] 02/14/2019 Speech Therapy Frequency (ACUTE ONLY) min 2x/week Treatment Duration 2 weeks      CHL IP ORAL PHASE 02/14/2019 Oral Phase Impaired Oral - Pudding Teaspoon -- Oral - Pudding Cup -- Oral - Honey Teaspoon -- Oral - Honey Cup -- Oral - Nectar Teaspoon -- Oral - Nectar Cup -- Oral - Nectar Straw -- Oral - Thin Teaspoon -- Oral - Thin Cup Premature spillage Oral - Thin Straw Premature spillage Oral - Puree WFL Oral - Mech Soft WFL Oral - Regular -- Oral - Multi-Consistency -- Oral - Pill WFL Oral Phase - Comment --  CHL IP PHARYNGEAL PHASE 02/14/2019 Pharyngeal Phase Impaired Pharyngeal- Pudding Teaspoon -- Pharyngeal -- Pharyngeal- Pudding Cup -- Pharyngeal -- Pharyngeal- Honey Teaspoon -- Pharyngeal -- Pharyngeal- Honey Cup -- Pharyngeal -- Pharyngeal- Nectar Teaspoon -- Pharyngeal -- Pharyngeal- Nectar Cup -- Pharyngeal -- Pharyngeal- Nectar Straw -- Pharyngeal -- Pharyngeal- Thin Teaspoon -- Pharyngeal -- Pharyngeal- Thin Cup Reduced pharyngeal peristalsis;Reduced epiglottic inversion;Reduced anterior laryngeal mobility;Reduced laryngeal elevation;Reduced tongue base retraction;Pharyngeal residue - valleculae;Pharyngeal residue - pyriform;Penetration/Aspiration during swallow Pharyngeal Material enters  airway, remains ABOVE vocal cords then ejected out Pharyngeal- Thin Straw Reduced pharyngeal peristalsis;Reduced epiglottic inversion;Reduced anterior laryngeal mobility;Reduced laryngeal elevation;Reduced tongue base retraction;Pharyngeal residue - valleculae;Pharyngeal residue - pyriform;Penetration/Aspiration during swallow Pharyngeal Material enters airway, CONTACTS cords and then ejected out Pharyngeal- Puree Reduced pharyngeal peristalsis;Reduced epiglottic inversion;Reduced anterior laryngeal mobility;Reduced laryngeal elevation;Reduced tongue base retraction;Pharyngeal residue - valleculae;Penetration/Aspiration during swallow  Pharyngeal Material enters airway, remains ABOVE vocal cords then ejected out Pharyngeal- Mechanical Soft Reduced pharyngeal peristalsis;Reduced epiglottic inversion;Reduced anterior laryngeal mobility;Reduced laryngeal elevation;Reduced tongue base retraction;Pharyngeal residue - valleculae;Penetration/Aspiration during swallow Pharyngeal Material enters airway, remains ABOVE vocal cords then ejected out Pharyngeal- Regular -- Pharyngeal -- Pharyngeal- Multi-consistency -- Pharyngeal -- Pharyngeal- Pill Reduced pharyngeal peristalsis;Reduced epiglottic inversion;Reduced anterior laryngeal mobility;Reduced laryngeal elevation;Reduced tongue base retraction Pharyngeal -- Pharyngeal Comment --  CHL IP CERVICAL ESOPHAGEAL PHASE 02/14/2019 Cervical Esophageal Phase Impaired Pudding Teaspoon -- Pudding Cup -- Honey Teaspoon -- Honey Cup -- Nectar Teaspoon -- Nectar Cup -- Nectar Straw -- Thin Teaspoon -- Thin Cup -- Thin Straw -- Puree -- Mechanical Soft -- Regular -- Multi-consistency -- Pill -- Cervical Esophageal Comment -- Virl Axe Nix 02/14/2019, 3:20 PM  Ivar Drape, M.A. CCC-SLP Acute Rehabilitation Services Pager [phone] Office [phone]                Subjective:  Patient seen at the bedside this morning.  Appears comfortable, hemodynamically stable for discharge.  Discharge Exam: Vitals:   02/17/19 0928 02/17/19 0931  BP:  (!) 147/88  Pulse:  82  Resp: 17   Temp:    SpO2:     Vitals:   02/17/19 0737 02/17/19 0822 02/17/19 0928 02/17/19 0931  BP:  (!) 147/88  (!) 147/88  Pulse:  82  82  Resp:  18 17   Temp:  98 F (36.7 C)    TempSrc:  Oral    SpO2: 93% 96%    Weight:      Height:        General: Pt is alert, awake, not in acute distress Cardiovascular: RRR, S1/S2 +, no rubs, no gallops Respiratory: CTA bilaterally, no wheezing, no rhonchi Abdominal: Soft, NT, ND, bowel sounds + Extremities: no edema, no cyanosis    The results of significant diagnostics from this  hospitalization (including imaging, microbiology, ancillary and laboratory) are listed below for reference.     Microbiology: Recent Results (from the past 240 hour(s))  SARS Coronavirus 2 (CEPHEID - Performed in [facility] hospital lab), Hosp Order     Status: None   Collection Time: 02/13/19  1:25 AM   Specimen: Nasopharyngeal Swab  Result Value Ref Range Status   SARS Coronavirus 2 NEGATIVE NEGATIVE Final    Comment: (NOTE) If result is NEGATIVE SARS-CoV-2 target nucleic acids are NOT DETECTED. The SARS-CoV-2 RNA is generally detectable in upper and lower  respiratory specimens during the acute phase of infection. The lowest  concentration of SARS-CoV-2 viral copies this assay can detect is 250  copies / mL. A negative result does not preclude SARS-CoV-2 infection  and should not be used as the sole basis for treatment or other  patient management decisions.  A negative result may occur with  improper specimen collection / handling, submission of specimen other  than nasopharyngeal swab, presence of viral mutation(s) within the  areas targeted by this assay, and inadequate number of viral copies  (<250 copies / mL). A negative result must be combined with clinical  observations, patient history, and epidemiological information. If result is  POSITIVE SARS-CoV-2 target nucleic acids are DETECTED. The SARS-CoV-2 RNA is generally detectable in upper and lower  respiratory specimens dur ing the acute phase of infection.  Positive  results are indicative of active infection with SARS-CoV-2.  Clinical  correlation with patient history and other diagnostic information is  necessary to determine patient infection status.  Positive results do  not rule out bacterial infection or co-infection with other viruses. If result is PRESUMPTIVE POSTIVE SARS-CoV-2 nucleic acids MAY BE PRESENT.   A presumptive positive result was obtained on the submitted specimen  and confirmed on repeat testing.   While 2019 novel coronavirus  (SARS-CoV-2) nucleic acids may be present in the submitted sample  additional confirmatory testing may be necessary for epidemiological  and / or clinical management purposes  to differentiate between  SARS-CoV-2 and other Sarbecovirus currently known to infect humans.  If clinically indicated additional testing with an alternate test  methodology [phone]) is advised. The SARS-CoV-2 RNA is generally  detectable in upper and lower respiratory sp ecimens during the acute  phase of infection. The expected result is Negative. Fact Sheet for Patients:  BoilerBrush.com.cyhttps://www.fda.gov/media/136312/download Fact Sheet for Healthcare Providers: https://pope.com/https://www.fda.gov/media/136313/download This test is not yet approved or cleared by the [place] FDA and has been authorized for detection and/or diagnosis of SARS-CoV-2 by FDA under an Emergency Use Authorization (EUA).  This EUA will remain in effect (meaning this test can be used) for the duration of the COVID-19 declaration under Section 564(b)(1) of the Act, 21 U.S.C. section 360bbb-3(b)(1), unless the authorization is terminated or revoked sooner. Performed at [facility], [address] [place], [place] [address]   SARS Coronavirus 2 (CEPHEID - Performed in [facility] hospital lab), Hosp Order     Status: None   Collection Time: 02/16/19  4:48 PM   Specimen: Nasopharyngeal Swab  Result Value Ref Range Status   SARS Coronavirus 2 NEGATIVE NEGATIVE Final    Comment: (NOTE) If result is NEGATIVE SARS-CoV-2 target nucleic acids are NOT DETECTED. The SARS-CoV-2 RNA is generally detectable in upper and lower  respiratory specimens during the acute phase of infection. The lowest  concentration of SARS-CoV-2 viral copies this assay can detect is 250  copies / mL. A negative result does not preclude SARS-CoV-2 infection  and should not be used as the sole basis for treatment or other  patient management decisions.   A negative result may occur with  improper specimen collection / handling, submission of specimen other  than nasopharyngeal swab, presence of viral mutation(s) within the  areas targeted by this assay, and inadequate number of viral copies  (<250 copies / mL). A negative result must be combined with clinical  observations, patient history, and epidemiological information. If result is POSITIVE SARS-CoV-2 target nucleic acids are DETECTED. The SARS-CoV-2 RNA is generally detectable in upper and lower  respiratory specimens dur ing the acute phase of infection.  Positive  results are indicative of active infection with SARS-CoV-2.  Clinical  correlation with patient history and other diagnostic information is  necessary to determine patient infection status.  Positive results do  not rule out bacterial infection or co-infection with other viruses. If result is PRESUMPTIVE POSTIVE SARS-CoV-2 nucleic acids MAY BE PRESENT.   A presumptive positive result was obtained on the submitted specimen  and confirmed on repeat testing.  While 2019 novel coronavirus  (SARS-CoV-2) nucleic acids may be present in the submitted sample  additional confirmatory testing may be necessary for epidemiological  and / or clinical management purposes  to differentiate between  SARS-CoV-2 and other Sarbecovirus currently known to infect humans.  If clinically indicated additional testing with an alternate test  methodology [phone] is advised. The SARS-CoV-2 RNA is generally  detectable in upper and lower respiratory sp ecimens during the acute  phase of infection. The expected result is Negative. Fact Sheet for Patients:  BoilerBrush.com.cy Fact Sheet for Healthcare Providers: https://pope.com/ This test is not yet approved or cleared by the [place] FDA and has been authorized for detection and/or diagnosis of SARS-CoV-2 by FDA under an Emergency Use  Authorization (EUA).  This EUA will remain in effect (meaning this test can be used) for the duration of the COVID-19 declaration under Section 564(b)(1) of the Act, 21 U.S.C. section 360bbb-3(b)(1), unless the authorization is terminated or revoked sooner. Performed at [facility], [address] [place], [place] [address]      Labs: BNP (last 3 results) No results for input(s): BNP in the last 8760 hours. Basic Metabolic Panel: Recent Labs  Lab 02/12/19 2048 02/13/19 0320 02/15/19 0147  NA 129* 130* 138  K 6.4* 5.1 3.4*  CL 90* 91* 103  CO2 GLUCOSE 87 86 146*  BUN 6* 8 6*  CREATININE 0.83 0.76 0.62  CALCIUM 8.6* 8.9 8.5*   Liver Function Tests: Recent Labs  Lab 02/15/19 0147  AST 102*  ALT 60*  ALKPHOS 59  BILITOT 0.7  PROT 5.2*  ALBUMIN 2.7*   No results for input(s): LIPASE, AMYLASE in the last 168 hours. No results for input(s): AMMONIA in the last 168 hours. CBC: Recent Labs  Lab 02/12/19 2048 02/13/19 0320  WBC 7.3 7.1  HGB 14.6 13.9  HCT 41.1 39.2  MCV 86.2 86.3  PLT 233 244   Cardiac Enzymes: No results for input(s): CKTOTAL, CKMB, CKMBINDEX, TROPONINI in the last 168 hours. BNP: Invalid input(s): POCBNP CBG: Recent Labs  Lab 02/12/19 1551  GLUCAP 102*   D-Dimer No results for input(s): DDIMER in the last 72 hours. Hgb A1c No results for input(s): HGBA1C in the last 72 hours. Lipid Profile No results for input(s): CHOL, HDL, LDLCALC, TRIG, CHOLHDL, LDLDIRECT in the last 72 hours. Thyroid function studies No results for input(s): TSH, T4TOTAL, T3FREE, THYROIDAB in the last 72 hours.  Invalid input(s): FREET3 Anemia work up No results for input(s): VITAMINB12, FOLATE, FERRITIN, TIBC, IRON, RETICCTPCT in the last 72 hours. Urinalysis No results found for: COLORURINE, APPEARANCEUR, LABSPEC, PHURINE, GLUCOSEU, HGBUR, BILIRUBINUR, KETONESUR, PROTEINUR, UROBILINOGEN, NITRITE, LEUKOCYTESUR Sepsis Labs Invalid input(s):  PROCALCITONIN,  WBC,  LACTICIDVEN Microbiology Recent Results (from the past 240 hour(s))  SARS Coronavirus 2 (CEPHEID - Performed in [facility] hospital lab), Hosp Order     Status: None   Collection Time: 02/13/19  1:25 AM   Specimen: Nasopharyngeal Swab  Result Value Ref Range Status   SARS Coronavirus 2 NEGATIVE NEGATIVE Final    Comment: (NOTE) If result is NEGATIVE SARS-CoV-2 target nucleic acids are NOT DETECTED. The SARS-CoV-2 RNA is generally detectable in upper and lower  respiratory specimens during the acute phase of infection. The lowest  concentration of SARS-CoV-2 viral copies this assay can detect is 250  copies / mL. A negative result does not preclude SARS-CoV-2 infection  and should not be used as the sole basis for treatment or other  patient management decisions.  A negative result may occur with  improper specimen collection / handling, submission of specimen other  than nasopharyngeal swab, presence of viral mutation(s) within the  areas targeted by this assay, and inadequate number of viral copies  (<250 copies / mL). A negative result must be combined with clinical  observations, patient history, and epidemiological information. If result is POSITIVE SARS-CoV-2 target nucleic acids are DETECTED. The SARS-CoV-2 RNA is generally detectable in upper and lower  respiratory specimens dur ing the acute phase of infection.  Positive  results are indicative of active infection with SARS-CoV-2.  Clinical  correlation with patient history and other diagnostic information is  necessary to determine patient infection status.  Positive results do  not rule out bacterial infection or co-infection with other viruses. If result is PRESUMPTIVE POSTIVE SARS-CoV-2 nucleic acids MAY BE PRESENT.   A presumptive positive result was obtained on the submitted specimen  and confirmed on repeat testing.  While 2019 novel coronavirus  (SARS-CoV-2) nucleic acids may be present in the  submitted sample  additional confirmatory testing may be necessary for epidemiological  and / or clinical management purposes  to differentiate between  SARS-CoV-2 and other Sarbecovirus currently known to infect humans.  If clinically indicated additional testing with an alternate test  methodology [phone] is advised. The SARS-CoV-2 RNA is generally  detectable in upper and lower respiratory sp ecimens during the acute  phase of infection. The expected result is Negative. Fact Sheet for Patients:  StrictlyIdeas.no Fact Sheet for Healthcare Providers: [email or web address] This test is not yet approved or cleared by the [place] FDA and has been authorized for detection and/or diagnosis of SARS-CoV-2 by FDA under an Emergency Use Authorization (EUA).  This EUA will remain in effect (meaning this test can be used) for the duration of the COVID-19 declaration under Section 564(b)(1) of the Act, 21 U.S.C. section 360bbb-3(b)(1), unless the authorization is terminated or revoked sooner. Performed at [facility], [place] [address] [place], [place] [address]   SARS Coronavirus 2 (CEPHEID - Performed in [place] hospital lab), Hosp Order     Status: None   Collection Time: 02/16/19  4:48 PM   Specimen: Nasopharyngeal Swab  Result Value Ref Range Status   SARS Coronavirus 2 NEGATIVE NEGATIVE Final    Comment: (NOTE) If result is NEGATIVE SARS-CoV-2 target nucleic acids are NOT DETECTED. The SARS-CoV-2 RNA is generally detectable in upper and lower  respiratory specimens during the acute phase of infection. The lowest  concentration of SARS-CoV-2 viral copies this assay can detect is 250  copies / mL. A negative result does not preclude SARS-CoV-2 infection  and should not be used as the sole basis for treatment or other  patient management decisions.  A negative result may occur with  improper specimen collection / handling,  submission of specimen other  than nasopharyngeal swab, presence of viral mutation(s) within the  areas targeted by this assay, and inadequate number of viral copies  (<250 copies / mL). A negative result must be combined with clinical  observations, patient history, and epidemiological information. If result is POSITIVE SARS-CoV-2 target nucleic acids are DETECTED. The SARS-CoV-2 RNA is generally detectable in upper and lower  respiratory specimens dur ing the acute phase of infection.  Positive  results are indicative of active infection with SARS-CoV-2.  Clinical  correlation with patient history and other diagnostic information is  necessary to determine patient infection status.  Positive results do  not rule out bacterial infection or co-infection with other viruses. If result is PRESUMPTIVE POSTIVE SARS-CoV-2 nucleic acids MAY BE PRESENT.  A presumptive positive result was obtained on the submitted specimen  and confirmed on repeat testing.  While 2019 novel coronavirus  (SARS-CoV-2) nucleic acids may be present in the submitted sample  additional confirmatory testing may be necessary for epidemiological  and / or clinical management purposes  to differentiate between  SARS-CoV-2 and other Sarbecovirus currently known to infect humans.  If clinically indicated additional testing with an alternate test  methodology [phone]) is advised. The SARS-CoV-2 RNA is generally  detectable in upper and lower respiratory sp ecimens during the acute  phase of infection. The expected result is Negative. Fact Sheet for Patients:  BoilerBrush.com.cy Fact Sheet for Healthcare Providers: https://pope.com/ This test is not yet approved or cleared by the [place] FDA and has been authorized for detection and/or diagnosis of SARS-CoV-2 by FDA under an Emergency Use Authorization (EUA).  This EUA will remain in effect (meaning this test can be  used) for the duration of the COVID-19 declaration under Section 564(b)(1) of the Act, 21 U.S.C. section 360bbb-3(b)(1), unless the authorization is terminated or revoked sooner. Performed at [facility], [address], [place], [place] [address]     Please note: You were cared for by a hospitalist during your hospital stay. Once you are discharged, your primary care physician will handle any further medical issues. Please note that NO REFILLS for any discharge medications will be authorized once you are discharged, as it is imperative that you return to your primary care physician (or establish a relationship with a primary care physician if you do not have one) for your post hospital discharge needs so that they can reassess your need for medications and monitor your lab values.    Time coordinating discharge: 40 minutes  SIGNED:   Burnadette Pop, MD  [facility] 02/17/2019, 10:56 AM Pager [phone]  If 7PM-7AM, please contact night-coverage [email or web address] Password [email or web address]

## 2019-02-18 ENCOUNTER — Encounter (HOSPITAL_COMMUNITY): Payer: Self-pay | Admitting: Nurse Practitioner

## 2019-02-18 ENCOUNTER — Other Ambulatory Visit: Payer: Self-pay

## 2019-02-18 MED ORDER — FOLIC ACID 1 MG PO TABS: 1.0000 mg | ORAL_TABLET | Freq: Every day | ORAL | 0 refills | Status: DC

## 2019-02-18 MED ORDER — METOPROLOL TARTRATE 50 MG PO TABS: 25.0000 mg | ORAL_TABLET | Freq: Two times a day (BID) | ORAL | 0 refills | Status: DC

## 2019-02-18 MED ORDER — IPRATROPIUM-ALBUTEROL 0.5-2.5 (3) MG/3ML IN SOLN: 3.0000 mL | RESPIRATORY_TRACT | 0 refills | Status: DC | PRN

## 2019-02-18 MED ORDER — PRAVASTATIN SODIUM 40 MG PO TABS: 40.0000 mg | ORAL_TABLET | Freq: Every day | ORAL | 0 refills | Status: DC

## 2019-02-18 MED ORDER — XARELTO 20 MG PO TABS: 20.0000 mg | ORAL_TABLET | Freq: Every day | ORAL | 0 refills | Status: DC

## 2019-02-18 MED ORDER — SYMBICORT 160-4.5 MCG/ACT IN AERO: 1.0000 | INHALATION_SPRAY | Freq: Every day | RESPIRATORY_TRACT | 1 refills | Status: DC

## 2019-02-18 MED ORDER — DEXILANT 60 MG PO CPDR: 60.0000 mg | DELAYED_RELEASE_CAPSULE | Freq: Every day | ORAL | 0 refills | Status: DC

## 2019-02-18 MED ORDER — THIAMINE HCL 100 MG PO TABS: 100.0000 mg | ORAL_TABLET | Freq: Every day | ORAL | 0 refills | Status: DC

## 2019-02-18 MED ORDER — MIRTAZAPINE 15 MG PO TABS: 15.0000 mg | ORAL_TABLET | Freq: Every day | ORAL | 0 refills | Status: DC

## 2019-02-18 MED ORDER — PROAIR HFA 108 (90 BASE) MCG/ACT IN AERS: 2.0000 | INHALATION_SPRAY | RESPIRATORY_TRACT | 0 refills | Status: DC | PRN

## 2019-02-18 MED ORDER — MONTELUKAST SODIUM 10 MG PO TABS: 10.0000 mg | ORAL_TABLET | Freq: Every day | ORAL | 0 refills | Status: DC

## 2019-02-18 MED ORDER — FLUOXETINE HCL 20 MG PO CAPS: 20.0000 mg | ORAL_CAPSULE | Freq: Every day | ORAL | 0 refills | Status: DC

## 2019-02-18 MED ORDER — DONEPEZIL HCL 5 MG PO TABS: 5.0000 mg | ORAL_TABLET | Freq: Every day | ORAL | 0 refills | Status: DC

## 2019-02-18 NOTE — TOC Progression Note (Signed)
Transition of Care [facility]) - Progression Note    Patient Details  Name: Marc Morris MRN: [id number] Date of Birth: [birth year]  [facility]) CM/SW [place], [place] Phone Number: 02/18/2019, 12:59 PM  Clinical Narrative:     CSW offered home health choice to the patient. The patient deferred to his niece for making the home health agency choice.   CSW called and spoke with the patient's niece, Lynelle Smoke. Tammy chose [facility]. She stated that her uncle has a nurse aide already and she would be able to give additional support. She did not know what agency the nurse aide was supplied through. She stated that the patient would need a rolling walker. She stated that he has a bedside commode at home. She stated that he did not need any additional equipment.   CSW called Georgina Snell with [facility] and left a message. CSW is awaiting a return phone call.   Expected Discharge Plan: [place] Barriers to Discharge: Equipment Delay  Expected Discharge Plan and Services Expected Discharge Plan: [place] In-house Referral: Clinical Social Work Discharge Planning Services: [place] Post Acute Care Choice: [place] arrangements for the past 2 months: Single Family Home Expected Discharge Date: 02/17/19               DME Arranged: Gilford Rile rolling         HH Arranged: Social Work [place] Agency: University Date [facility] Agency Contacted: 02/18/19 Time [place]: [phone] Representative spoke with at [place]: Corey-Left voicemail   Social Determinants of Health ([place]) Interventions    Readmission Risk Interventions No flowsheet data found.

## 2019-02-18 NOTE — Progress Notes (Signed)
Patient in a stable condition discharge education reviewed with patient and his niece Tammy on the phone, they verbalized understanding, iv removed, tele dc ccmd notified, patient belongings at bedside, patient awaiting PTAR for transportation home.

## 2019-02-18 NOTE — Care Management (Signed)
02-18-19 Hospital follow up appointment scheduled at the PCP office. No further needs from CM at this time. Bethena Roys, RN,BSN Case Manager [phone]

## 2019-02-18 NOTE — Plan of Care (Signed)
Care plan adequate for discharge.

## 2019-02-18 NOTE — Progress Notes (Signed)
Patient transported home by PTAR 

## 2019-02-18 NOTE — TOC Transition Note (Addendum)
Transition of Care [facility]) - CM/SW Discharge Note   Patient Details  Name: Marc Morris MRN: [id number] Date of Birth: 1940-11-09  Transition of Care [facility]) CM/SW Contact:  Gelene Mink, [place] Phone Number: 02/18/2019, 2:19 PM   Clinical Narrative:     Patient will DC to: Home Anticipated DC date: 02/18/2019 Family notified: Yes Transport by: Corey Harold   Per MD patient ready for DC to . RN, patient, patient's family, and facility notified of DC. t. DC packet on chart. Ambulance transport requested for patient.   CSW will sign off for now as social work intervention is no longer needed. Please consult [place] again if new needs arise.  CSW called the following and they were not able to accept the patient:   [facility] called and spoke with Butch Penny with [place]. They will be able to provide physical therapy, occupational therapy, speech therapy, nurse, and social worker. CSW spoke with Zack at [place], his 3-n-1 and rolling walker will be delivered to the home per the niece.   The niece requested the patient be discharged home by [facility]. CSW will arrange transportation.   Final next level of care: [place] Barriers to Discharge: No Barriers Identified   Patient Goals and CMS Choice Patient states their goals for this hospitalization and ongoing recovery are:: Pt will return home with home health CMS Medicare.gov Compare Post Acute Care list provided to:: Patient Choice offered to / list presented to : Patient  Discharge Placement                       Discharge Plan and Services In-house Referral: Clinical Social Work Discharge Planning Services: NA Post Acute Care Choice: Home Health          DME Arranged: Gilford Rile rolling, 3-N-1 DME Agency: [employer] Date DME Agency Contacted: 02/18/19 Time DME Agency Contacted: [phone] Representative spoke with at DME Agency: [place]: Social  Work [employer] Agency: [place] ([place]) Date [place]: 02/18/19 Time [place]: 1418 Representative spoke with at [place]: [place] ([place]) Interventions     Readmission Risk Interventions No flowsheet data found.

## 2019-02-18 NOTE — Progress Notes (Signed)
PROGRESS NOTE    Marc Morris  ZOX:[id number] DOB: [birth year] DOA: 02/12/2019 PCP: Shelbie AmmonsHaque, Imran P, MD   Brief Narrative:  Patient is a 78 year old male with history of paroxysmal A. fib, hypertension, COPD, hyperlipidemia, coronary artery disease, alcohol abuse, seizures who was [facility]2019 after accidentally cannulization of the right carotid artery internal jugular during placement of a central line. He is s/p removal of central line with repair of R common carotid and internal jugularonDr Early on 02/12/19.PT evaluated him and recommended skilled nursing facility on discharge but his insurance denied.  He is hemodynamically stable for discharge to home today.  He will follow-up with vascular surgery as an outpatient.   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 Procedure done on 02/12/2019 by vascular surgery. No hematoma.Follow up with vascular surgery as an outpatient  Paroxysmal A. WJX:BJYNWGNFAfib:Currently stable. Heart rate controlled. Continue Xarelto. On metoprolol. Echocardiogram showed ejection fraction of 55 to 60% and suggestive of diastolic CHF.  History of chronic alcohol abuse:Not in withdrawal at present. Continue thiamine, folic acid.  Patient has mild elevated liver enzymes secondary to chronic alcohol abuse.  COPD/tobacco abuse: Smoking cessation advised. Continue bronchodilators as needed. Not in acute exacerbation  Depression/anxiety: Continue Remeron and fluoxetine. Currently stable. Continue Ativan  for anxiety  Dysphagia: Patient seen by speech. Modified barium swallow done. Recommended dysphagia 2 diet.  Hypokalemia: Supplemented with potassium.  Debility/deconditioning: Patient recommended by physical therapy for skilled nursing facility.           DVT prophylaxis: Xarelto Code Status: Full Family Communication: Called sister and niece phone not received Disposition Plan: Home today   Consultants: Vascular surgery  Procedures: Vascular surgery  Antimicrobials:  Anti-infectives (From admission, onward)   Start     Dose/Rate Route Frequency Ordered Stop   02/13/19 0000  ceFAZolin (ANCEF) IVPB 2g/100 mL premix     2 g 200 mL/hr over 30 Minutes Intravenous Every 8 hours 02/12/19 1845 02/13/19 0841      Subjective:  Patient seen and examined the bedside this morning.  Hemodynamically stable.  Looks comfortable.  Explained about the discharge planning.  Patient verbalized understanding. Objective: Vitals:   02/17/19 1945 02/17/19 2323 02/18/19 0729 02/18/19 1145  BP: 101/66 119/77 (!) 154/85 129/76  Pulse: 65 60  71  Resp: 15 16 14 20   Temp: 98.2 F (36.8 C) 97.8 F (36.6 C) 98.3 F (36.8 C) 98.4 F (36.9 C)  TempSrc: Oral Oral Oral Oral  SpO2: 100% 100% 97% 97%  Weight:      Height:        Intake/Output Summary (Last 24 hours) at 02/18/2019 1249 Last data filed at 02/18/2019 0800 Gross per 24 hour  Intake 360 ml  Output 325 ml  Net 35 ml   Filed Weights   02/12/19 1858  Weight: 51.8 kg    Examination:  General exam: Appears calm and comfortable ,Not in distress,average built HEENT:PERRL,Oral mucosa moist, Ear/Nose normal on gross exam Respiratory system: Bilateral equal air entry, normal vesicular breath sounds, no wheezes or crackles  Cardiovascular  system: S1 & S2 heard, RRR. No JVD, murmurs, rubs, gallops or clicks. No pedal edema. Gastrointestinal system: Abdomen is nondistended, soft and nontender. No organomegaly or masses felt.  Normal bowel sounds heard. Central nervous system: Alert and oriented. No focal neurological deficits. Extremities: No edema, no clubbing ,no cyanosis, distal peripheral pulses palpable. Skin: No rashes, lesions or ulcers,no icterus ,no pallor   Data Reviewed: I have personally reviewed following labs and imaging studies  CBC: Recent Labs  Lab 02/12/19 2048 02/13/19 0320  WBC 7.3 7.1  HGB 14.6 13.9  HCT 41.1 39.2  MCV 86.2 86.3  PLT 233 683   Basic Metabolic Panel: Recent Labs  Lab 02/12/19 2048 02/13/19 0320 02/15/19 0147 02/17/19 1343  NA 129* 130* 138 136  K 6.4* 5.1 3.4* 3.5  CL 90* 91* 103 100  CO2 22 22 26 27   GLUCOSE 87 86 146* 132*  BUN 6* 8 6* 12  CREATININE 0.83 0.76 0.62 0.61  CALCIUM 8.6* 8.9 8.5* 8.7*   GFR: Estimated Creatinine Clearance: 58.5 mL/min (by C-G formula based on SCr of 0.61 mg/dL). Liver Function Tests: Recent Labs  Lab 02/15/19 0147  AST 102*  ALT 60*  ALKPHOS 59  BILITOT 0.7  PROT 5.2*  ALBUMIN 2.7*   No results for input(s): LIPASE, AMYLASE in the last 168 hours. No results for input(s): AMMONIA in the last 168 hours. Coagulation Profile: No results for input(s): INR, PROTIME in the last 168 hours. Cardiac Enzymes: No results for input(s): CKTOTAL, CKMB, CKMBINDEX, TROPONINI in the last 168 hours. BNP (last 3 results) No results for input(s): PROBNP in the last 8760 hours. HbA1C: No results for input(s): HGBA1C in the last 72 hours. CBG: Recent Labs  Lab 02/12/19 1551  GLUCAP 102*   Lipid Profile: No results for input(s): CHOL, HDL, LDLCALC, TRIG, CHOLHDL, LDLDIRECT in the last 72 hours. Thyroid Function Tests: No results for input(s): TSH, T4TOTAL, FREET4, T3FREE, THYROIDAB in the last 72 hours. Anemia Panel: No results for input(s): VITAMINB12, FOLATE, FERRITIN, TIBC, IRON, RETICCTPCT in the last 72 hours. Sepsis Labs: No results for input(s): PROCALCITON, LATICACIDVEN in the last 168 hours.  Recent Results (from  the past 240 hour(s))  SARS Coronavirus 2 (CEPHEID - Performed in [place] hospital lab), Hosp Order     Status: None   Collection Time: 02/13/19  1:25 AM   Specimen: Nasopharyngeal Swab  Result Value Ref Range Status   SARS Coronavirus 2 NEGATIVE NEGATIVE Final    Comment: (NOTE) If result is NEGATIVE SARS-CoV-2 target nucleic acids are NOT DETECTED. The SARS-CoV-2 RNA is generally detectable in upper and lower  respiratory specimens during the acute phase of infection. The lowest  concentration of SARS-CoV-2 viral copies this assay can detect is 250  copies / mL. A negative result does not preclude SARS-CoV-2 infection  and should not be used as the sole basis for treatment or other  patient management decisions.  A negative result may occur with  improper specimen collection / handling, submission of specimen other  than nasopharyngeal swab, presence of viral mutation(s) within the  areas targeted by this assay, and inadequate number of viral copies  (<250 copies / mL). A negative result must be combined with clinical  observations, patient history, and epidemiological information. If result is POSITIVE SARS-CoV-2 target nucleic acids are DETECTED. The SARS-CoV-2 RNA is generally detectable in upper and lower  respiratory specimens dur ing the acute phase of infection.  Positive  results are indicative of active  infection with SARS-CoV-2.  Clinical  correlation with patient history and other diagnostic information is  necessary to determine patient infection status.  Positive results do  not rule out bacterial infection or co-infection with other viruses. If result is PRESUMPTIVE POSTIVE SARS-CoV-2 nucleic acids MAY BE PRESENT.   A presumptive positive result was obtained on the submitted specimen  and confirmed on repeat testing.  While 2019 novel coronavirus  (SARS-CoV-2) nucleic acids may be present in the submitted sample  additional confirmatory testing may be necessary  for epidemiological  and / or clinical management purposes  to differentiate between  SARS-CoV-2 and other Sarbecovirus currently known to infect humans.  If clinically indicated additional testing with an alternate test  methodology [phone]([id number]) is advised. The SARS-CoV-2 RNA is generally  detectable in upper and lower respiratory sp ecimens during the acute  phase of infection. The expected result is Negative. Fact Sheet for Patients:  BoilerBrush.com.cyhttps://www.fda.gov/media/136312/download Fact Sheet for Healthcare Providers: https://pope.com/https://www.fda.gov/media/136313/download This test is not yet approved or cleared by the [place] FDA and has been authorized for detection and/or diagnosis of SARS-CoV-2 by FDA under an Emergency Use Authorization (EUA).  This EUA will remain in effect (meaning this test can be used) for the duration of the COVID-19 declaration under Section 564(b)(1) of the Act, 21 U.S.C. section 360bbb-3(b)(1), unless the authorization is terminated or revoked sooner. Performed at [facility], [address] [place], [place] [address]   SARS Coronavirus 2 (CEPHEID - Performed in [facility]), Hosp Order     Status: None   Collection Time: 02/16/19  4:48 PM   Specimen: Nasopharyngeal Swab  Result Value Ref Range Status   SARS Coronavirus 2 NEGATIVE NEGATIVE Final    Comment: (NOTE) If result is NEGATIVE SARS-CoV-2 target nucleic acids are NOT DETECTED. The SARS-CoV-2 RNA is generally detectable in upper and lower  respiratory specimens during the acute phase of infection. The lowest  concentration of SARS-CoV-2 viral copies this assay can detect is 250  copies / mL. A negative result does not preclude SARS-CoV-2 infection  and should not be used as the sole basis for treatment or other  patient management decisions.  A negative result may occur with  improper specimen collection / handling, submission of specimen other  than nasopharyngeal swab, presence of  viral mutation(s) within the  areas targeted by this assay, and inadequate number of viral copies  (<250 copies / mL). A negative result must be combined with clinical  observations, patient history, and epidemiological information. If result is POSITIVE SARS-CoV-2 target nucleic acids are DETECTED. The SARS-CoV-2 RNA is generally detectable in upper and lower  respiratory specimens dur ing the acute phase of infection.  Positive  results are indicative of active infection with SARS-CoV-2.  Clinical  correlation with patient history and other diagnostic information is  necessary to determine patient infection status.  Positive results do  not rule out bacterial infection or co-infection with other viruses. If result is PRESUMPTIVE POSTIVE SARS-CoV-2 nucleic acids MAY BE PRESENT.   A presumptive positive result was obtained on the submitted specimen  and confirmed on repeat testing.  While 2019 novel coronavirus  (SARS-CoV-2) nucleic acids may be present in the submitted sample  additional confirmatory testing may be necessary for epidemiological  and / or clinical management purposes  to differentiate between  SARS-CoV-2 and other Sarbecovirus currently known to infect humans.  If clinically indicated additional testing with an alternate test  methodology [phone]([id number]) is advised.  The SARS-CoV-2 RNA is generally  detectable in upper and lower respiratory sp ecimens during the acute  phase of infection. The expected result is Negative. Fact Sheet for Patients:  BoilerBrush.com.cyhttps://www.fda.gov/media/136312/download Fact Sheet for Healthcare Providers: https://pope.com/https://www.fda.gov/media/136313/download This test is not yet approved or cleared by the [place] FDA and has been authorized for detection and/or diagnosis of SARS-CoV-2 by FDA under an Emergency Use Authorization (EUA).  This EUA will remain in effect (meaning this test can be used) for the duration of the COVID-19 declaration under Section  564(b)(1) of the Act, 21 U.S.C. section 360bbb-3(b)(1), unless the authorization is terminated or revoked sooner. Performed at [facility], [address] [place], [place] [address]          Radiology Studies: No results found.      Scheduled Meds: . donepezil  5 mg Oral Daily  . FLUoxetine  20 mg Oral Daily  . folic acid  1 mg Oral Daily  . guaiFENesin  600 mg Oral BID  . metoprolol tartrate  25 mg Oral BID  . mirtazapine  15 mg Oral QHS  . mometasone-formoterol  2 puff Inhalation BID  . montelukast  10 mg Oral Daily  . multivitamin with minerals  1 tablet Oral Daily  . pantoprazole  40 mg Oral Daily  . pravastatin  40 mg Oral Daily  . rivaroxaban  20 mg Oral Q supper  . roflumilast  500 mcg Oral Daily  . senna-docusate  2 tablet Oral QHS  . sodium chloride flush  3 mL Intravenous Q12H  . tamsulosin  0.4 mg Oral Daily  . thiamine  100 mg Oral Daily   Or  . thiamine  100 mg Intravenous Daily   Continuous Infusions:   LOS: 6 days    Time spent: 25 mins.More than 50% of that time was spent in counseling and/or coordination of care.      Burnadette PopAmrit Tarrance Januszewski, MD [facility] Pager [phone]  If 7PM-7AM, please contact night-coverage [email or web address] Password [facility] 02/18/2019, 12:49 PM

## 2019-03-11 ENCOUNTER — Encounter: Payer: Medicare Other | Admitting: Vascular Surgery

## 2019-03-25 ENCOUNTER — Encounter: Payer: Medicare Other | Admitting: Vascular Surgery

## 2019-06-07 DIAGNOSIS — J9692 Respiratory failure, unspecified with hypercapnia: Secondary | ICD-10-CM

## 2019-06-07 DIAGNOSIS — R4182 Altered mental status, unspecified: Secondary | ICD-10-CM

## 2019-06-07 DIAGNOSIS — E875 Hyperkalemia: Secondary | ICD-10-CM

## 2019-06-07 DIAGNOSIS — J441 Chronic obstructive pulmonary disease with (acute) exacerbation: Secondary | ICD-10-CM

## 2019-06-08 DIAGNOSIS — J441 Chronic obstructive pulmonary disease with (acute) exacerbation: Secondary | ICD-10-CM | POA: Diagnosis not present

## 2019-06-08 DIAGNOSIS — E875 Hyperkalemia: Secondary | ICD-10-CM | POA: Diagnosis not present

## 2019-06-08 DIAGNOSIS — J9692 Respiratory failure, unspecified with hypercapnia: Secondary | ICD-10-CM | POA: Diagnosis not present

## 2019-06-08 DIAGNOSIS — R4182 Altered mental status, unspecified: Secondary | ICD-10-CM | POA: Diagnosis not present

## 2019-06-09 DIAGNOSIS — E875 Hyperkalemia: Secondary | ICD-10-CM | POA: Diagnosis not present

## 2019-06-09 DIAGNOSIS — R4182 Altered mental status, unspecified: Secondary | ICD-10-CM | POA: Diagnosis not present

## 2019-06-09 DIAGNOSIS — J9692 Respiratory failure, unspecified with hypercapnia: Secondary | ICD-10-CM | POA: Diagnosis not present

## 2019-06-09 DIAGNOSIS — J441 Chronic obstructive pulmonary disease with (acute) exacerbation: Secondary | ICD-10-CM | POA: Diagnosis not present

## 2019-06-10 DIAGNOSIS — J9692 Respiratory failure, unspecified with hypercapnia: Secondary | ICD-10-CM | POA: Diagnosis not present

## 2019-06-10 DIAGNOSIS — J441 Chronic obstructive pulmonary disease with (acute) exacerbation: Secondary | ICD-10-CM | POA: Diagnosis not present

## 2019-06-10 DIAGNOSIS — R4182 Altered mental status, unspecified: Secondary | ICD-10-CM | POA: Diagnosis not present

## 2019-06-10 DIAGNOSIS — E875 Hyperkalemia: Secondary | ICD-10-CM | POA: Diagnosis not present

## 2019-06-11 DIAGNOSIS — R4182 Altered mental status, unspecified: Secondary | ICD-10-CM | POA: Diagnosis not present

## 2019-06-11 DIAGNOSIS — J441 Chronic obstructive pulmonary disease with (acute) exacerbation: Secondary | ICD-10-CM | POA: Diagnosis not present

## 2019-06-11 DIAGNOSIS — E875 Hyperkalemia: Secondary | ICD-10-CM | POA: Diagnosis not present

## 2019-06-11 DIAGNOSIS — J9692 Respiratory failure, unspecified with hypercapnia: Secondary | ICD-10-CM | POA: Diagnosis not present

## 2019-06-12 DIAGNOSIS — E875 Hyperkalemia: Secondary | ICD-10-CM | POA: Diagnosis not present

## 2019-06-12 DIAGNOSIS — J9692 Respiratory failure, unspecified with hypercapnia: Secondary | ICD-10-CM | POA: Diagnosis not present

## 2019-06-12 DIAGNOSIS — R4182 Altered mental status, unspecified: Secondary | ICD-10-CM | POA: Diagnosis not present

## 2019-06-12 DIAGNOSIS — J441 Chronic obstructive pulmonary disease with (acute) exacerbation: Secondary | ICD-10-CM | POA: Diagnosis not present

## 2019-06-13 DIAGNOSIS — J9692 Respiratory failure, unspecified with hypercapnia: Secondary | ICD-10-CM | POA: Diagnosis not present

## 2019-06-13 DIAGNOSIS — R4182 Altered mental status, unspecified: Secondary | ICD-10-CM | POA: Diagnosis not present

## 2019-06-13 DIAGNOSIS — E875 Hyperkalemia: Secondary | ICD-10-CM | POA: Diagnosis not present

## 2019-06-13 DIAGNOSIS — J441 Chronic obstructive pulmonary disease with (acute) exacerbation: Secondary | ICD-10-CM | POA: Diagnosis not present

## 2019-06-14 DIAGNOSIS — E875 Hyperkalemia: Secondary | ICD-10-CM | POA: Diagnosis not present

## 2019-06-14 DIAGNOSIS — R4182 Altered mental status, unspecified: Secondary | ICD-10-CM | POA: Diagnosis not present

## 2019-06-14 DIAGNOSIS — J441 Chronic obstructive pulmonary disease with (acute) exacerbation: Secondary | ICD-10-CM | POA: Diagnosis not present

## 2019-06-14 DIAGNOSIS — J9692 Respiratory failure, unspecified with hypercapnia: Secondary | ICD-10-CM | POA: Diagnosis not present

## 2019-06-15 DIAGNOSIS — J9692 Respiratory failure, unspecified with hypercapnia: Secondary | ICD-10-CM | POA: Diagnosis not present

## 2019-06-15 DIAGNOSIS — R4182 Altered mental status, unspecified: Secondary | ICD-10-CM | POA: Diagnosis not present

## 2019-06-15 DIAGNOSIS — J441 Chronic obstructive pulmonary disease with (acute) exacerbation: Secondary | ICD-10-CM | POA: Diagnosis not present

## 2019-06-15 DIAGNOSIS — E875 Hyperkalemia: Secondary | ICD-10-CM | POA: Diagnosis not present

## 2019-06-16 DIAGNOSIS — R4182 Altered mental status, unspecified: Secondary | ICD-10-CM | POA: Diagnosis not present

## 2019-06-16 DIAGNOSIS — J9692 Respiratory failure, unspecified with hypercapnia: Secondary | ICD-10-CM | POA: Diagnosis not present

## 2019-06-16 DIAGNOSIS — J441 Chronic obstructive pulmonary disease with (acute) exacerbation: Secondary | ICD-10-CM | POA: Diagnosis not present

## 2019-06-16 DIAGNOSIS — E875 Hyperkalemia: Secondary | ICD-10-CM | POA: Diagnosis not present

## 2019-06-17 DIAGNOSIS — J441 Chronic obstructive pulmonary disease with (acute) exacerbation: Secondary | ICD-10-CM | POA: Diagnosis not present

## 2019-06-17 DIAGNOSIS — J9692 Respiratory failure, unspecified with hypercapnia: Secondary | ICD-10-CM | POA: Diagnosis not present

## 2019-06-17 DIAGNOSIS — R4182 Altered mental status, unspecified: Secondary | ICD-10-CM | POA: Diagnosis not present

## 2019-06-17 DIAGNOSIS — E875 Hyperkalemia: Secondary | ICD-10-CM | POA: Diagnosis not present

## 2019-06-18 DIAGNOSIS — J441 Chronic obstructive pulmonary disease with (acute) exacerbation: Secondary | ICD-10-CM | POA: Diagnosis not present

## 2019-06-18 DIAGNOSIS — J9692 Respiratory failure, unspecified with hypercapnia: Secondary | ICD-10-CM | POA: Diagnosis not present

## 2019-06-18 DIAGNOSIS — R4182 Altered mental status, unspecified: Secondary | ICD-10-CM | POA: Diagnosis not present

## 2019-06-18 DIAGNOSIS — E875 Hyperkalemia: Secondary | ICD-10-CM | POA: Diagnosis not present

## 2021-08-29 ENCOUNTER — Emergency Department (HOSPITAL_COMMUNITY): Payer: Medicare Other

## 2021-08-29 ENCOUNTER — Encounter (HOSPITAL_COMMUNITY): Payer: Self-pay | Admitting: Emergency Medicine

## 2021-08-29 ENCOUNTER — Inpatient Hospital Stay (HOSPITAL_COMMUNITY)
Admission: EM | Admit: 2021-08-29 | Discharge: 2021-09-06 | DRG: 896 | Disposition: A | Discharge status: Skilled Nursing Facility | Payer: Medicare Other | Attending: Internal Medicine | Admitting: Internal Medicine

## 2021-08-29 DIAGNOSIS — F10239 Alcohol dependence with withdrawal, unspecified: Principal | ICD-10-CM | POA: Diagnosis present

## 2021-08-29 DIAGNOSIS — Z20822 Contact with and (suspected) exposure to covid-19: Secondary | ICD-10-CM | POA: Diagnosis present

## 2021-08-29 DIAGNOSIS — R54 Age-related physical debility: Secondary | ICD-10-CM | POA: Diagnosis present

## 2021-08-29 DIAGNOSIS — F32A Depression, unspecified: Secondary | ICD-10-CM | POA: Diagnosis present

## 2021-08-29 DIAGNOSIS — D649 Anemia, unspecified: Secondary | ICD-10-CM | POA: Diagnosis present

## 2021-08-29 DIAGNOSIS — R638 Other symptoms and signs concerning food and fluid intake: Secondary | ICD-10-CM | POA: Diagnosis not present

## 2021-08-29 DIAGNOSIS — J441 Chronic obstructive pulmonary disease with (acute) exacerbation: Secondary | ICD-10-CM | POA: Diagnosis present

## 2021-08-29 DIAGNOSIS — J449 Chronic obstructive pulmonary disease, unspecified: Secondary | ICD-10-CM | POA: Diagnosis present

## 2021-08-29 DIAGNOSIS — R296 Repeated falls: Secondary | ICD-10-CM | POA: Diagnosis present

## 2021-08-29 DIAGNOSIS — I480 Paroxysmal atrial fibrillation: Secondary | ICD-10-CM | POA: Diagnosis present

## 2021-08-29 DIAGNOSIS — R627 Adult failure to thrive: Secondary | ICD-10-CM | POA: Diagnosis present

## 2021-08-29 DIAGNOSIS — Z79899 Other long term (current) drug therapy: Secondary | ICD-10-CM

## 2021-08-29 DIAGNOSIS — R251 Tremor, unspecified: Secondary | ICD-10-CM

## 2021-08-29 DIAGNOSIS — E871 Hypo-osmolality and hyponatremia: Secondary | ICD-10-CM | POA: Diagnosis present

## 2021-08-29 DIAGNOSIS — R112 Nausea with vomiting, unspecified: Secondary | ICD-10-CM

## 2021-08-29 DIAGNOSIS — Y900 Blood alcohol level of less than 20 mg/100 ml: Secondary | ICD-10-CM | POA: Diagnosis present

## 2021-08-29 DIAGNOSIS — Z7951 Long term (current) use of inhaled steroids: Secondary | ICD-10-CM

## 2021-08-29 DIAGNOSIS — F419 Anxiety disorder, unspecified: Secondary | ICD-10-CM | POA: Diagnosis present

## 2021-08-29 DIAGNOSIS — Z7901 Long term (current) use of anticoagulants: Secondary | ICD-10-CM | POA: Diagnosis not present

## 2021-08-29 DIAGNOSIS — I10 Essential (primary) hypertension: Secondary | ICD-10-CM | POA: Diagnosis present

## 2021-08-29 DIAGNOSIS — J9621 Acute and chronic respiratory failure with hypoxia: Secondary | ICD-10-CM | POA: Diagnosis present

## 2021-08-29 LAB — URINALYSIS, ROUTINE W REFLEX MICROSCOPIC
Bilirubin Urine: NEGATIVE
Glucose, UA: NEGATIVE mg/dL
Ketones, ur: 15 mg/dL — AB
Leukocytes,Ua: NEGATIVE
Nitrite: NEGATIVE
Protein, ur: NEGATIVE mg/dL
Specific Gravity, Urine: 1.02 (ref 1.005–1.030)
pH: 6 (ref 5.0–8.0)

## 2021-08-29 LAB — ETHANOL: Alcohol, Ethyl (B): <10 mg/dL (ref <10)

## 2021-08-29 LAB — CBC
HCT: 41 % (ref 39.0–52.0)
Hemoglobin: 13.2 g/dL (ref 13.0–17.0)
MCH: 27.8 pg (ref 26.0–34.0)
MCHC: 32.2 g/dL (ref 30.0–36.0)
MCV: 86.3 fL (ref 80.0–100.0)
Platelets: 231 10*3/uL (ref 150–400)
RBC: 4.75 MIL/uL (ref 4.22–5.81)
RDW: 15.8 % — ABNORMAL HIGH (ref 11.5–15.5)
WBC: 6.1 10*3/uL (ref 4.0–10.5)
nRBC: 0 % (ref 0.0–0.2)

## 2021-08-29 LAB — LIPASE, BLOOD: Lipase: 26 U/L (ref 11–51)

## 2021-08-29 LAB — PROTIME-INR
INR: 0.8 (ref 0.8–1.2)
Prothrombin Time: 11.3 seconds — ABNORMAL LOW (ref 11.4–15.2)

## 2021-08-29 LAB — BASIC METABOLIC PANEL
Anion gap: 16 — ABNORMAL HIGH (ref 5–15)
BUN: 9 mg/dL (ref 8–23)
CO2: 23 mmol/L (ref 22–32)
Calcium: 9.6 mg/dL (ref 8.9–10.3)
Chloride: 92 mmol/L — ABNORMAL LOW (ref 98–111)
Creatinine, Ser: 0.83 mg/dL (ref 0.61–1.24)
GFR, Estimated: >60 mL/min (ref >60)
Glucose, Bld: 108 mg/dL — ABNORMAL HIGH (ref 70–99)
Potassium: 5.1 mmol/L (ref 3.5–5.1)
Sodium: 131 mmol/L — ABNORMAL LOW (ref 135–145)

## 2021-08-29 LAB — URINALYSIS, MICROSCOPIC (REFLEX): Bacteria, UA: NONE SEEN

## 2021-08-29 LAB — HEPATIC FUNCTION PANEL
ALT: 28 U/L (ref 0–44)
AST: 39 U/L (ref 15–41)
Albumin: 4 g/dL (ref 3.5–5.0)
Alkaline Phosphatase: 84 U/L (ref 38–126)
Bilirubin, Direct: 0.2 mg/dL (ref 0.0–0.2)
Indirect Bilirubin: 0.7 mg/dL (ref 0.3–0.9)
Total Bilirubin: 0.9 mg/dL (ref 0.3–1.2)
Total Protein: 8 g/dL (ref 6.5–8.1)

## 2021-08-29 MED ORDER — IPRATROPIUM-ALBUTEROL 0.5-2.5 (3) MG/3ML IN SOLN
3.0000 mL | Freq: Once | RESPIRATORY_TRACT | Status: AC
  Administered 2021-08-29: 3 mL via RESPIRATORY_TRACT
  Filled 2021-08-29: qty 3

## 2021-08-29 MED ORDER — DONEPEZIL HCL 5 MG PO TABS
5.0000 mg | ORAL_TABLET | Freq: Every day | ORAL | Status: DC
  Administered 2021-08-29 – 2021-09-06 (×9): 5 mg via ORAL
  Filled 2021-08-29 (×9): qty 1

## 2021-08-29 MED ORDER — ROFLUMILAST 500 MCG PO TABS
500.0000 ug | ORAL_TABLET | Freq: Every day | ORAL | Status: DC
  Administered 2021-08-30 – 2021-09-06 (×8): 500 ug via ORAL
  Filled 2021-08-29 (×9): qty 1

## 2021-08-29 MED ORDER — LORAZEPAM 2 MG/ML IJ SOLN
0.0000 mg | Freq: Four times a day (QID) | INTRAMUSCULAR | Status: AC
  Administered 2021-08-30: 2 mg via INTRAVENOUS
  Filled 2021-08-29: qty 1

## 2021-08-29 MED ORDER — IPRATROPIUM-ALBUTEROL 0.5-2.5 (3) MG/3ML IN SOLN
3.0000 mL | RESPIRATORY_TRACT | Status: DC | PRN
  Administered 2021-08-31 (×2): 3 mL via RESPIRATORY_TRACT
  Filled 2021-08-29 (×2): qty 3

## 2021-08-29 MED ORDER — ACETAMINOPHEN 650 MG RE SUPP: 650.0000 mg | Freq: Four times a day (QID) | RECTAL | Status: DC | PRN

## 2021-08-29 MED ORDER — LORAZEPAM 2 MG/ML IJ SOLN
0.0000 mg | Freq: Two times a day (BID) | INTRAMUSCULAR | Status: AC
  Filled 2021-08-29: qty 1

## 2021-08-29 MED ORDER — SODIUM CHLORIDE 0.9 % IV BOLUS
1000.0000 mL | Freq: Once | INTRAVENOUS | Status: AC
  Administered 2021-08-29: 1000 mL via INTRAVENOUS

## 2021-08-29 MED ORDER — METOPROLOL TARTRATE 25 MG PO TABS
25.0000 mg | ORAL_TABLET | Freq: Two times a day (BID) | ORAL | Status: DC
  Administered 2021-08-30 – 2021-09-05 (×13): 25 mg via ORAL
  Filled 2021-08-29 (×16): qty 1

## 2021-08-29 MED ORDER — THIAMINE HCL 100 MG/ML IJ SOLN
100.0000 mg | Freq: Every day | INTRAMUSCULAR | Status: DC
  Administered 2021-08-29 – 2021-09-05 (×2): 100 mg via INTRAVENOUS
  Filled 2021-08-29 (×3): qty 2

## 2021-08-29 MED ORDER — TAMSULOSIN HCL 0.4 MG PO CAPS
0.4000 mg | ORAL_CAPSULE | Freq: Every day | ORAL | Status: DC
  Administered 2021-08-30 – 2021-09-06 (×8): 0.4 mg via ORAL
  Filled 2021-08-29 (×8): qty 1

## 2021-08-29 MED ORDER — FOLIC ACID 1 MG PO TABS
1.0000 mg | ORAL_TABLET | Freq: Every day | ORAL | Status: DC
  Administered 2021-08-29 – 2021-09-06 (×9): 1 mg via ORAL
  Filled 2021-08-29 (×9): qty 1

## 2021-08-29 MED ORDER — MONTELUKAST SODIUM 10 MG PO TABS
10.0000 mg | ORAL_TABLET | Freq: Every day | ORAL | Status: DC
  Administered 2021-08-30 – 2021-09-06 (×8): 10 mg via ORAL
  Filled 2021-08-29 (×8): qty 1

## 2021-08-29 MED ORDER — ONDANSETRON HCL 4 MG/2ML IJ SOLN
4.0000 mg | Freq: Once | INTRAMUSCULAR | Status: AC
  Administered 2021-08-29: 4 mg via INTRAVENOUS
  Filled 2021-08-29: qty 2

## 2021-08-29 MED ORDER — RIVAROXABAN 20 MG PO TABS
20.0000 mg | ORAL_TABLET | Freq: Every day | ORAL | Status: DC
  Administered 2021-08-29 – 2021-09-06 (×9): 20 mg via ORAL
  Filled 2021-08-29: qty 2
  Filled 2021-08-29 (×8): qty 1

## 2021-08-29 MED ORDER — PRAVASTATIN SODIUM 40 MG PO TABS
40.0000 mg | ORAL_TABLET | Freq: Every day | ORAL | Status: DC
  Administered 2021-08-30 – 2021-09-06 (×8): 40 mg via ORAL
  Filled 2021-08-29 (×8): qty 1

## 2021-08-29 MED ORDER — ONDANSETRON HCL 4 MG/2ML IJ SOLN: 4.0000 mg | Freq: Four times a day (QID) | INTRAMUSCULAR | Status: DC | PRN

## 2021-08-29 MED ORDER — GUAIFENESIN ER 600 MG PO TB12
1200.0000 mg | ORAL_TABLET | Freq: Two times a day (BID) | ORAL | Status: DC
  Administered 2021-08-30 – 2021-09-06 (×16): 1200 mg via ORAL
  Filled 2021-08-29 (×16): qty 2

## 2021-08-29 MED ORDER — LORAZEPAM 2 MG/ML IJ SOLN
2.0000 mg | Freq: Once | INTRAMUSCULAR | Status: AC
Start: 2021-08-29 — End: 2021-08-29
  Administered 2021-08-29: 2 mg via INTRAVENOUS
  Filled 2021-08-29: qty 1

## 2021-08-29 MED ORDER — IPRATROPIUM-ALBUTEROL 0.5-2.5 (3) MG/3ML IN SOLN
3.0000 mL | Freq: Four times a day (QID) | RESPIRATORY_TRACT | Status: DC
  Administered 2021-08-29 – 2021-08-31 (×7): 3 mL via RESPIRATORY_TRACT
  Filled 2021-08-29 (×6): qty 3

## 2021-08-29 MED ORDER — SODIUM CHLORIDE 0.9 % IV SOLN
Freq: Once | INTRAVENOUS | Status: AC
  Filled 2021-08-29: qty 1000

## 2021-08-29 MED ORDER — LORAZEPAM 1 MG PO TABS
0.0000 mg | ORAL_TABLET | Freq: Two times a day (BID) | ORAL | Status: AC
  Administered 2021-08-31: 2 mg via ORAL
  Administered 2021-09-01: 4 mg via ORAL
  Administered 2021-09-01: 2 mg via ORAL
  Administered 2021-09-02: 4 mg via ORAL
  Filled 2021-08-29: qty 2
  Filled 2021-08-29: qty 4
  Filled 2021-08-29: qty 2
  Filled 2021-08-29: qty 4

## 2021-08-29 MED ORDER — THIAMINE HCL 100 MG PO TABS
100.0000 mg | ORAL_TABLET | Freq: Every day | ORAL | Status: DC
  Administered 2021-08-30 – 2021-09-06 (×7): 100 mg via ORAL
  Filled 2021-08-29 (×8): qty 1

## 2021-08-29 MED ORDER — PANTOPRAZOLE SODIUM 40 MG PO TBEC
40.0000 mg | DELAYED_RELEASE_TABLET | Freq: Every day | ORAL | Status: DC
  Administered 2021-08-30 – 2021-09-06 (×8): 40 mg via ORAL
  Filled 2021-08-29 (×8): qty 1

## 2021-08-29 MED ORDER — ALBUTEROL SULFATE (2.5 MG/3ML) 0.083% IN NEBU
2.5000 mg | INHALATION_SOLUTION | Freq: Four times a day (QID) | RESPIRATORY_TRACT | Status: DC | PRN
  Administered 2021-08-30: 2.5 mg via RESPIRATORY_TRACT
  Filled 2021-08-29 (×2): qty 3

## 2021-08-29 MED ORDER — LISINOPRIL 5 MG PO TABS
5.0000 mg | ORAL_TABLET | Freq: Every day | ORAL | Status: DC
  Administered 2021-08-29 – 2021-09-06 (×9): 5 mg via ORAL
  Filled 2021-08-29 (×9): qty 1

## 2021-08-29 MED ORDER — PREDNISONE 20 MG PO TABS
40.0000 mg | ORAL_TABLET | Freq: Every day | ORAL | Status: AC
  Administered 2021-08-30 – 2021-09-03 (×5): 40 mg via ORAL
  Filled 2021-08-29 (×5): qty 2

## 2021-08-29 MED ORDER — FLUOXETINE HCL 20 MG PO CAPS
20.0000 mg | ORAL_CAPSULE | Freq: Every day | ORAL | Status: DC
  Administered 2021-08-29 – 2021-09-06 (×9): 20 mg via ORAL
  Filled 2021-08-29 (×9): qty 1

## 2021-08-29 MED ORDER — LORAZEPAM 1 MG PO TABS
0.0000 mg | ORAL_TABLET | Freq: Four times a day (QID) | ORAL | Status: AC
  Administered 2021-08-30: 1 mg via ORAL
  Administered 2021-08-31: 4 mg via ORAL
  Filled 2021-08-29: qty 4
  Filled 2021-08-29: qty 1

## 2021-08-29 MED ORDER — MOMETASONE FURO-FORMOTEROL FUM 200-5 MCG/ACT IN AERO
2.0000 | INHALATION_SPRAY | Freq: Two times a day (BID) | RESPIRATORY_TRACT | Status: DC
  Administered 2021-08-29 – 2021-09-06 (×13): 2 via RESPIRATORY_TRACT
  Filled 2021-08-29 (×2): qty 8.8

## 2021-08-29 MED ORDER — DOXYCYCLINE HYCLATE 100 MG PO TABS
100.0000 mg | ORAL_TABLET | Freq: Two times a day (BID) | ORAL | Status: AC
  Administered 2021-08-30 – 2021-09-03 (×10): 100 mg via ORAL
  Filled 2021-08-29 (×11): qty 1

## 2021-08-29 MED ORDER — ONDANSETRON HCL 4 MG PO TABS
4.0000 mg | ORAL_TABLET | Freq: Four times a day (QID) | ORAL | Status: DC | PRN
  Filled 2021-08-29: qty 1

## 2021-08-29 MED ORDER — MIRTAZAPINE 15 MG PO TABS
15.0000 mg | ORAL_TABLET | Freq: Every day | ORAL | Status: DC
  Administered 2021-08-29 – 2021-09-06 (×9): 15 mg via ORAL
  Filled 2021-08-29 (×9): qty 1

## 2021-08-29 MED ORDER — THIAMINE HCL 100 MG PO TABS: 100.0000 mg | ORAL_TABLET | Freq: Every day | ORAL | Status: DC

## 2021-08-29 MED ORDER — ACETAMINOPHEN 325 MG PO TABS
650.0000 mg | ORAL_TABLET | Freq: Four times a day (QID) | ORAL | Status: DC | PRN
  Administered 2021-08-30 – 2021-09-06 (×10): 650 mg via ORAL
  Filled 2021-08-29 (×10): qty 2

## 2021-08-29 NOTE — ED Triage Notes (Addendum)
Patient BIB [facility] for evaluation of fall on Thursday and Saturday. Patient unsure if he takes an anticoagulant. Patient alert, oriented, and in no apparent distress at this time. Patient complaint of left rib pain that he states started after falling.  EMS Vitals BP 137/82 HR 82 RR 16 98% on 3L [place] (baseline supplemental O2)

## 2021-08-29 NOTE — H&P (Signed)
History and Physical    Marc Morris [id number] DOB: January 24, 1941 DOA: 08/29/2021  PCP: Bonnita Nasuti, MD (Confirm with patient/family/NH records and if not entered, this has to be entered at [facility] point of entry) Patient coming from: Home  I have personally briefly reviewed patient's old medical records in [place]  Chief Complaint: Cough, SOB  HPI: Marc Morris is a 81 y.o. 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  Patient is poor historian, and I tried to reach patient's family: Wife's phone number does not work and left a message patient niece.  Patient provided some of the history and the rest of the history supplemented by ER record.  Patient reported that for 4 to 5 days he has had productive cough with yellowish sputum production, episode of chills but no fever.  He was admitted recently has started binge drinking, as much as to 240 ounces of beer a day.  Last drink was last night.  This morning, patient started to have tremors and unsteady gait and fell to the left side, denied any loss of consciousness or head injuries.  He also has been using breathing treatment around-the-clock with little help and then come to the hospital.  ED Course: Patient was stabilized on 4 L oxygen, no tachycardia no hypotension.  Afebrile.  Chest x-ray showed no acute infiltrates or fractures of the ribs.  CT head and neck negative for acute findings.  Physical exam found patient in active alcohol withdrawal with elevated CIWA, IV benzos given the patient was started on CIWA protocol.  IV antibiotics started in ED along with breathing treatment.  Review of Systems: As per HPI otherwise 14 point review of systems negative.    Past Medical History:  Diagnosis Date   COPD (chronic obstructive pulmonary disease) ([place])     Headache(784.0)    Hematuria - cause not known 2012   scoped by urologist in [place], did not find a cause.   Hypertension    Pneumonia 2012   hospitalized at [facility]   Seizures [facility])    age 79- was dinking alot of alchol   Shortness of breath    after standing, then it "gets all right"   Vision loss    "needs glasses"    Past Surgical History:  Procedure Laterality Date   CYSTOSCOPY  2012   ENDARTERECTOMY Right 02/12/2019   Procedure: Right neck exploration and removal of carotid central line.;  Surgeon: Rosetta Posner, MD;  Location: [place];  Service: Vascular;  Laterality: Right;   VEIN REPAIR Right 02/12/2019   Procedure: REPAIR RIGHT JUGULAR VEIN;  Surgeon: Rosetta Posner, MD;  Location: [place];  Service: Vascular;  Laterality: Right;     reports that he has been smoking. He has a 3.70 pack-year smoking history. He has quit using smokeless tobacco. He reports that he does not drink alcohol and does not use drugs.  No Known Allergies  Family History  Problem Relation Age of Onset   Anesthesia problems Neg Hx      Prior to Admission medications   Medication Sig Start Date End Date Taking? Authorizing Provider  ALPRAZolam Duanne Moron) 1 MG tablet Take 1 tablet (1 mg total) by mouth 2 (two) times daily as needed. For anxiety 02/17/19   Shelly Coss, MD  DALIRESP 500 MCG TABS tablet  Take 500 mcg by mouth daily. 02/11/19   [provider]  DEXILANT 60 MG capsule Take 1 capsule (60 mg total) by mouth daily. 02/18/19   Shelly Coss, MD  donepezil (ARICEPT) 5 MG tablet Take 1 tablet (5 mg total) by mouth daily. 02/18/19   Shelly Coss, MD  FLUoxetine (PROZAC) 20 MG capsule Take 1 capsule (20 mg total) by mouth daily. 02/18/19   Shelly Coss, MD  folic acid (FOLVITE) 1 MG tablet Take 1 tablet (1 mg total) by mouth daily. 02/18/19   Shelly Coss, MD  ipratropium-albuterol (DUONEB) 0.5-2.5 (3) MG/3ML SOLN Take 3 mLs by nebulization every 4 (four) hours as needed  (shortness of breath). 02/18/19   Shelly Coss, MD  lisinopril (ZESTRIL) 5 MG tablet Take 1 tablet (5 mg total) by mouth daily. May take a half tab if blood pressure is too low 02/17/19   Shelly Coss, MD  metoprolol tartrate (LOPRESSOR) 50 MG tablet Take 0.5 tablets (25 mg total) by mouth 2 (two) times daily. 02/18/19   Shelly Coss, MD  mirtazapine (REMERON) 15 MG tablet Take 1 tablet (15 mg total) by mouth daily. 02/18/19   Shelly Coss, MD  montelukast (SINGULAIR) 10 MG tablet Take 1 tablet (10 mg total) by mouth daily. 02/18/19   Shelly Coss, MD  pravastatin (PRAVACHOL) 40 MG tablet Take 1 tablet (40 mg total) by mouth daily. 02/18/19   Shelly Coss, MD  PROAIR HFA 108 [phone] Base) MCG/ACT inhaler Inhale 2 puffs into the lungs as needed for wheezing or shortness of breath. 02/18/19   Shelly Coss, MD  SYMBICORT 160-4.5 MCG/ACT inhaler Inhale 1 puff into the lungs daily. 02/18/19   Shelly Coss, MD  Tamsulosin HCl (FLOMAX) 0.4 MG CAPS Take 0.4 mg by mouth daily.    [provider]  thiamine 100 MG tablet Take 1 tablet (100 mg total) by mouth daily. 02/18/19   Shelly Coss, MD  XARELTO 20 MG TABS tablet Take 1 tablet (20 mg total) by mouth daily. 02/18/19   Shelly Coss, MD    Physical Exam: Vitals:   08/29/21 0919 08/29/21 1453 08/29/21 1633  BP: 128/84 (!) 165/86 (!) 158/82  Pulse: 98  90  Resp: 16  17  Temp: 98.7 F (37.1 C)  98.4 F (36.9 C)  TempSrc: Oral  Oral  SpO2: 97% 94% 96%    Constitutional: NAD, calm, comfortable Vitals:   08/29/21 0919 08/29/21 1453 08/29/21 1633  BP: 128/84 (!) 165/86 (!) 158/82  Pulse: 98  90  Resp: 16  17  Temp: 98.7 F (37.1 C)  98.4 F (36.9 C)  TempSrc: Oral  Oral  SpO2: 97% 94% 96%   Eyes: PERRL, lids and conjunctivae normal ENMT: Mucous membranes are moist. Posterior pharynx clear of any exudate or lesions.Normal dentition.  Neck: normal, supple, no masses, no thyromegaly Respiratory: Diminished breathing auscultation  bilaterally, diffused wheezing, scattered crackles on bilateral lower fields.  Increasing respiratory effort. No accessory muscle use.  Cardiovascular: Regular rate and rhythm, no murmurs / rubs / gallops. No extremity edema. 2+ pedal pulses. No carotid bruits.  Abdomen: no tenderness, no masses palpated. No hepatosplenomegaly. Bowel sounds positive.  Musculoskeletal: no clubbing / cyanosis. No joint deformity upper and lower extremities. Good ROM, no contractures. Normal muscle tone.  Skin: no rashes, lesions, ulcers. No induration Neurologic: CN 2-12 grossly intact. Sensation intact, DTR normal. Strength 5/5 in all 4.  Psychiatric: Normal judgment and insight. Alert and oriented x 3. Normal mood.  Labs on Admission: I have personally reviewed following labs and imaging studies  CBC: Recent Labs  Lab 08/29/21 0949  WBC 6.1  HGB 13.2  HCT 41.0  MCV 86.3  PLT [id number]   Basic Metabolic Panel: Recent Labs  Lab 08/29/21 0949  NA 131*  K 5.1  CL 92*  CO2 23  GLUCOSE 108*  BUN 9  CREATININE 0.83  CALCIUM 9.6   GFR: CrCl cannot be calculated (Unknown ideal weight.). Liver Function Tests: Recent Labs  Lab 08/29/21 1218  AST 39  ALT 28  ALKPHOS 84  BILITOT 0.9  PROT 8.0  ALBUMIN 4.0   Recent Labs  Lab 08/29/21 1218  LIPASE 26   No results for input(s): AMMONIA in the last 168 hours. Coagulation Profile: Recent Labs  Lab 08/29/21 1218  INR 0.8   Cardiac Enzymes: No results for input(s): CKTOTAL, CKMB, CKMBINDEX, TROPONINI in the last 168 hours. BNP (last 3 results) No results for input(s): PROBNP in the last 8760 hours. HbA1C: No results for input(s): HGBA1C in the last 72 hours. CBG: No results for input(s): GLUCAP in the last 168 hours. Lipid Profile: No results for input(s): CHOL, HDL, LDLCALC, TRIG, CHOLHDL, LDLDIRECT in the last 72 hours. Thyroid Function Tests: No results for input(s): TSH, T4TOTAL, FREET4, T3FREE, THYROIDAB in the last 72  hours. Anemia Panel: No results for input(s): VITAMINB12, FOLATE, FERRITIN, TIBC, IRON, RETICCTPCT in the last 72 hours. Urine analysis: No results found for: COLORURINE, APPEARANCEUR, [place], [place], GLUCOSEU, HGBUR, BILIRUBINUR, KETONESUR, PROTEINUR, UROBILINOGEN, NITRITE, LEUKOCYTESUR  Radiological Exams on Admission: DG Ribs Unilateral W/Chest Left  Result Date: 08/29/2021 CLINICAL DATA:  Left lateral rib pain after fall last Thursday. EXAM: LEFT RIBS AND CHEST - 3+ VIEW COMPARISON:  Chest x-ray dated July 31, 2021. FINDINGS: No fracture or other bone lesions are seen involving the ribs. The heart size and mediastinal contours are within normal limits. Similar emphysematous changes with chronic scarring at the lung bases. No focal consolidation, pleural effusion, or pneumothorax. Air within the colon under the right hemidiaphragm. IMPRESSION: 1. No acute rib fracture. 2. COPD. No acute cardiopulmonary disease. Electronically Signed   By: Titus Dubin M.D.   On: 08/29/2021 10:13   CT Head Wo Contrast  Result Date: 08/29/2021 CLINICAL DATA:  Head and neck trauma due to a fall today. EXAM: CT HEAD WITHOUT CONTRAST CT CERVICAL SPINE WITHOUT CONTRAST TECHNIQUE: Multidetector CT imaging of the head and cervical spine was performed following the standard protocol without intravenous contrast. Multiplanar CT image reconstructions of the cervical spine were also generated. RADIATION DOSE REDUCTION: This exam was performed according to the departmental dose-optimization program which includes automated exposure control, adjustment of the mA and/or kV according to patient size and/or use of iterative reconstruction technique. COMPARISON:  CT head 06/07/2019. FINDINGS: CT HEAD FINDINGS Brain: The patient's head is tilted in the CT gantry. There is no evidence of acute intracranial hemorrhage, mass lesion, brain edema or extra-axial fluid collection. There is generalized atrophy with prominence of the  ventricles and subarachnoid spaces. Mild chronic small vessel ischemic changes in the periventricular white matter are unchanged. There is no CT evidence of acute cortical infarction. Vascular: Prominent intracranial vascular calcifications. No hyperdense vessel identified. Skull: Negative for fracture or focal lesion. Sinuses/Orbits: The visualized paranasal sinuses and mastoid air cells are clear. No orbital abnormalities are seen. Other: None. CT CERVICAL SPINE FINDINGS Alignment: There is a convex right scoliosis with a mild degenerative anterolisthesis at C5-6, C6-7 and C7-T1. Skull  base and vertebrae: No evidence of acute cervical spine fracture or traumatic subluxation. There is multilevel spondylosis. Soft tissues and spinal canal: No prevertebral fluid or swelling. No visible canal hematoma. Disc levels: No large disc herniation identified. There is multilevel spondylosis with uncinate spurring and facet hypertrophy. Upper chest: Severe emphysema noted at both lung apices. Other: Bilateral carotid atherosclerosis. IMPRESSION: 1. No acute intracranial or calvarial findings. 2. No evidence of acute cervical spine fracture, traumatic subluxation or static signs of instability. 3. Intracranial atrophy and mild chronic small vessel ischemic changes. 4. 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 5.  Emphysema (ICD10-J43.9). Electronically Signed   By: Richardean Sale M.D.   On: 08/29/2021 12:25   CT Cervical Spine Wo Contrast  Result Date: 08/29/2021 CLINICAL DATA:  Head and neck trauma due to a fall today. EXAM: CT HEAD WITHOUT CONTRAST CT CERVICAL SPINE WITHOUT CONTRAST TECHNIQUE: Multidetector CT imaging of the head and cervical spine was performed following the standard protocol without intravenous contrast. Multiplanar CT image reconstructions of the cervical spine were also generated. RADIATION DOSE REDUCTION: This exam was performed according to the departmental dose-optimization  program which includes automated exposure control, adjustment of the mA and/or kV according to patient size and/or use of iterative reconstruction technique. COMPARISON:  CT head 06/07/2019. FINDINGS: CT HEAD FINDINGS Brain: The patient's head is tilted in the CT gantry. There is no evidence of acute intracranial hemorrhage, mass lesion, brain edema or extra-axial fluid collection. There is generalized atrophy with prominence of the ventricles and subarachnoid spaces. Mild chronic small vessel ischemic changes in the periventricular white matter are unchanged. There is no CT evidence of acute cortical infarction. Vascular: Prominent intracranial vascular calcifications. No hyperdense vessel identified. Skull: Negative for fracture or focal lesion. Sinuses/Orbits: The visualized paranasal sinuses and mastoid air cells are clear. No orbital abnormalities are seen. Other: None. CT CERVICAL SPINE FINDINGS Alignment: There is a convex right scoliosis with a mild degenerative anterolisthesis at C5-6, C6-7 and C7-T1. Skull base and vertebrae: No evidence of acute cervical spine fracture or traumatic subluxation. There is multilevel spondylosis. Soft tissues and spinal canal: No prevertebral fluid or swelling. No visible canal hematoma. Disc levels: No large disc herniation identified. There is multilevel spondylosis with uncinate spurring and facet hypertrophy. Upper chest: Severe emphysema noted at both lung apices. Other: Bilateral carotid atherosclerosis. IMPRESSION: 1. No acute intracranial or calvarial findings. 2. No evidence of acute cervical spine fracture, traumatic subluxation or static signs of instability. 3. Intracranial atrophy and mild chronic small vessel ischemic changes. 4. 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 5.  Emphysema (ICD10-J43.9). Electronically Signed   By: Richardean Sale M.D.   On: 08/29/2021 12:25    EKG: Ordered  Assessment/Plan Principal Problem:   COPD (chronic  obstructive pulmonary disease) ([place])  (please populate well all problems here in Problem List. (For example, if patient is on BP meds at home and you resume or decide to hold them, it is a problem that needs to be her. Same for CAD, COPD, HLD and so on)  Acute on chronic hypoxic respite failure secondary to acute COPD exacerbation -Escalate antibiotics coverage to doxycycline twice daily -Short course of p.o. prednisone -Breathing treatment -Robitussin -Flutter valve and incentive spirometry -Continue Daliresp and montelukast  Alcohol withdrawal -CIWA protocol with as needed benzos. -banana bag x1  HTN -Continue lisinopril and metoprolol.  Anxiety/depression -Continue SSRI  PAF -CT head and neck reassuring H&H stable, continue Xarelto  Frequent falls -Likely secondary  to alcohol intoxication.  CIWA protocol, PT evaluation tomorrow.   DVT prophylaxis: Xarelto Code Status: Full code Family Communication: Wife's phone number does not work, left message to niece Disposition Plan: Patient is sick with complicated medical problems, expect more than 2 midnight hospital stay Consults called: None Admission status: Telemetry admission   Lequita Halt MD [facility] Pager [phone]  08/29/2021, 5:35 PM

## 2021-08-29 NOTE — ED Provider Notes (Signed)
[facility] EMERGENCY DEPARTMENT Provider Note   CSN: [id number] Arrival date & time: 08/29/21  [id number]     History  Chief Complaint  Patient presents with   Marc Morris is a 81 y.o. male.  Past medical history includes hypertension, COPD on 3 L nasal cannula at home, atrial fibrillation, alcohol use disorder.  Patient presents the emergency department for evaluation of recent falls.  Patient states that he has been having increased nausea and vomiting as well as abdominal pain.  Patient denies any hematemesis.  States she is having normal bowel movements.  He was walking to go get his breathing treatment when he felt dizzy and he fell.  One fall was on Thursday and one was on Saturday, both with similar circumstances.  Patient does not recall hitting his head on either occasion.  He is also unsure if he is on any blood thinners, however his medication list has Xarelto on it.  He only complains of left rib cage pain that has been present after one of his falls.  Patient states that he recently started drinking again right after Christmas.  Has been drinking 2- 40 ounce of beer daily.  Patient states he last drank yesterday evening.   Fall Associated symptoms include abdominal pain.      Home Medications Prior to Admission medications   Medication Sig Start Date End Date Taking? Authorizing Provider  ALPRAZolam Duanne Moron) 1 MG tablet Take 1 tablet (1 mg total) by mouth 2 (two) times daily as needed. For anxiety 02/17/19   Shelly Coss, MD  DALIRESP 500 MCG TABS tablet Take 500 mcg by mouth daily. 02/11/19   [provider]  DEXILANT 60 MG capsule Take 1 capsule (60 mg total) by mouth daily. 02/18/19   Shelly Coss, MD  donepezil (ARICEPT) 5 MG tablet Take 1 tablet (5 mg total) by mouth daily. 02/18/19   Shelly Coss, MD  FLUoxetine (PROZAC) 20 MG capsule Take 1 capsule (20 mg total) by mouth daily. 02/18/19   Shelly Coss, MD  folic acid (FOLVITE) 1  MG tablet Take 1 tablet (1 mg total) by mouth daily. 02/18/19   Shelly Coss, MD  ipratropium-albuterol (DUONEB) 0.5-2.5 (3) MG/3ML SOLN Take 3 mLs by nebulization every 4 (four) hours as needed (shortness of breath). 02/18/19   Shelly Coss, MD  lisinopril (ZESTRIL) 5 MG tablet Take 1 tablet (5 mg total) by mouth daily. May take a half tab if blood pressure is too low 02/17/19   Shelly Coss, MD  metoprolol tartrate (LOPRESSOR) 50 MG tablet Take 0.5 tablets (25 mg total) by mouth 2 (two) times daily. 02/18/19   Shelly Coss, MD  mirtazapine (REMERON) 15 MG tablet Take 1 tablet (15 mg total) by mouth daily. 02/18/19   Shelly Coss, MD  montelukast (SINGULAIR) 10 MG tablet Take 1 tablet (10 mg total) by mouth daily. 02/18/19   Shelly Coss, MD  pravastatin (PRAVACHOL) 40 MG tablet Take 1 tablet (40 mg total) by mouth daily. 02/18/19   Shelly Coss, MD  PROAIR HFA 108 [phone] Base) MCG/ACT inhaler Inhale 2 puffs into the lungs as needed for wheezing or shortness of breath. 02/18/19   Shelly Coss, MD  SYMBICORT 160-4.5 MCG/ACT inhaler Inhale 1 puff into the lungs daily. 02/18/19   Shelly Coss, MD  Tamsulosin HCl (FLOMAX) 0.4 MG CAPS Take 0.4 mg by mouth daily.    [provider]  thiamine 100 MG tablet Take 1 tablet (100 mg total)  by mouth daily. 02/18/19   Shelly Coss, MD  XARELTO 20 MG TABS tablet Take 1 tablet (20 mg total) by mouth daily. 02/18/19   Shelly Coss, MD      Allergies    Patient has no known allergies.    Review of Systems   Review of Systems  Respiratory:         Left rib cage pain  Gastrointestinal:  Positive for abdominal pain, nausea and vomiting.  All other systems reviewed and are negative.  Physical Exam Updated Vital Signs BP (!) 165/86 (BP Location: Right Arm)    Pulse 98    Temp 98.7 F (37.1 C) (Oral)    Resp 16    SpO2 94%  Physical Exam Vitals and nursing note reviewed.  Constitutional:      General: He is not in acute distress.     Appearance: Normal appearance. He is ill-appearing. He is not toxic-appearing or diaphoretic.     Comments: Patient appears chronically ill  HENT:     Head: Normocephalic and atraumatic.     Nose: No nasal deformity.     Mouth/Throat:     Lips: Pink. No lesions.     Mouth: No injury, lacerations, oral lesions or angioedema.     Pharynx: Uvula midline. No pharyngeal swelling or uvula swelling.  Eyes:     General: Gaze aligned appropriately. No scleral icterus.       Right eye: No discharge.        Left eye: No discharge.     Extraocular Movements: Extraocular movements intact.     Conjunctiva/sclera: Conjunctivae normal.     Right eye: Right conjunctiva is not injected. No exudate or hemorrhage.    Left eye: Left conjunctiva is not injected. No exudate or hemorrhage.    Pupils: Pupils are equal, round, and reactive to light.     Comments: No nystagmus  Cardiovascular:     Rate and Rhythm: Normal rate and regular rhythm.     Pulses: Normal pulses.          Radial pulses are 2+ on the right side and 2+ on the left side.       Dorsalis pedis pulses are 2+ on the right side and 2+ on the left side.     Heart sounds: Normal heart sounds, S1 normal and S2 normal. Heart sounds not distant. No murmur heard.   No friction rub. No gallop. No S3 or S4 sounds.  Pulmonary:     Effort: Pulmonary effort is normal. No accessory muscle usage or respiratory distress.     Breath sounds: Normal breath sounds. No stridor. No wheezing, rhonchi or rales.  Chest:     Chest wall: No tenderness.  Abdominal:     General: Abdomen is flat. Bowel sounds are normal. There is no distension.     Palpations: Abdomen is soft. There is no mass or pulsatile mass.     Tenderness: There is abdominal tenderness. There is no guarding or rebound.     Comments: Moderate epigastric and RUQ ttp  Musculoskeletal:     Right lower leg: No edema.     Left lower leg: No edema.  Skin:    General: Skin is warm and dry.      Coloration: Skin is not jaundiced or pale.     Findings: No bruising, erythema, lesion or rash.  Neurological:     General: No focal deficit present.     Mental Status: He is alert and  oriented to person, place, and time.     GCS: GCS eye subscore is 4. GCS verbal subscore is 5. GCS motor subscore is 6.     Comments: Alert and Oriented x 3 Speech difficult to understand, but no aphasia noted Cranial Nerve testing - PERRLA. EOM intact. No Nystagmus - No facial asymmetry - Uvula and Tongue Midline - Accessory Muscles intact Motor: - 5/5 motor strength in all four extremities.  Sensation: - Grossly intact in all four extremities.  Coordination:  - Finger to nose and heel to shin intact bilaterally    Psychiatric:        Mood and Affect: Mood normal.        Behavior: Behavior normal. Behavior is cooperative.    ED Results / Procedures / Treatments   Labs (all labs ordered are listed, but only abnormal results are displayed) Labs Reviewed  CBC - Abnormal; Notable for the following components:      Result Value   RDW 15.8 (*)    All other components within normal limits  BASIC METABOLIC PANEL - Abnormal; Notable for the following components:   Sodium 131 (*)    Chloride 92 (*)    Glucose, Bld 108 (*)    Anion gap 16 (*)    All other components within normal limits  PROTIME-INR - Abnormal; Notable for the following components:   Prothrombin Time 11.3 (*)    All other components within normal limits  LIPASE, BLOOD  ETHANOL  HEPATIC FUNCTION PANEL  URINALYSIS, ROUTINE W REFLEX MICROSCOPIC  AMMONIA    EKG None  Radiology DG Ribs Unilateral W/Chest Left  Result Date: 08/29/2021 CLINICAL DATA:  Left lateral rib pain after fall last Thursday. EXAM: LEFT RIBS AND CHEST - 3+ VIEW COMPARISON:  Chest x-ray dated July 31, 2021. FINDINGS: No fracture or other bone lesions are seen involving the ribs. The heart size and mediastinal contours are within normal limits. Similar  emphysematous changes with chronic scarring at the lung bases. No focal consolidation, pleural effusion, or pneumothorax. Air within the colon under the right hemidiaphragm. IMPRESSION: 1. No acute rib fracture. 2. COPD. No acute cardiopulmonary disease. Electronically Signed   By: Titus Dubin M.D.   On: 08/29/2021 10:13   CT Head Wo Contrast  Result Date: 08/29/2021 CLINICAL DATA:  Head and neck trauma due to a fall today. EXAM: CT HEAD WITHOUT CONTRAST CT CERVICAL SPINE WITHOUT CONTRAST TECHNIQUE: Multidetector CT imaging of the head and cervical spine was performed following the standard protocol without intravenous contrast. Multiplanar CT image reconstructions of the cervical spine were also generated. RADIATION DOSE REDUCTION: This exam was performed according to the departmental dose-optimization program which includes automated exposure control, adjustment of the mA and/or kV according to patient size and/or use of iterative reconstruction technique. COMPARISON:  CT head 06/07/2019. FINDINGS: CT HEAD FINDINGS Brain: The patient's head is tilted in the CT gantry. There is no evidence of acute intracranial hemorrhage, mass lesion, brain edema or extra-axial fluid collection. There is generalized atrophy with prominence of the ventricles and subarachnoid spaces. Mild chronic small vessel ischemic changes in the periventricular white matter are unchanged. There is no CT evidence of acute cortical infarction. Vascular: Prominent intracranial vascular calcifications. No hyperdense vessel identified. Skull: Negative for fracture or focal lesion. Sinuses/Orbits: The visualized paranasal sinuses and mastoid air cells are clear. No orbital abnormalities are seen. Other: None. CT CERVICAL SPINE FINDINGS Alignment: There is a convex right scoliosis with a mild degenerative anterolisthesis at  C5-6, C6-7 and C7-T1. Skull base and vertebrae: No evidence of acute cervical spine fracture or traumatic subluxation.  There is multilevel spondylosis. Soft tissues and spinal canal: No prevertebral fluid or swelling. No visible canal hematoma. Disc levels: No large disc herniation identified. There is multilevel spondylosis with uncinate spurring and facet hypertrophy. Upper chest: Severe emphysema noted at both lung apices. Other: Bilateral carotid atherosclerosis. IMPRESSION: 1. No acute intracranial or calvarial findings. 2. No evidence of acute cervical spine fracture, traumatic subluxation or static signs of instability. 3. Intracranial atrophy and mild chronic small vessel ischemic changes. 4. 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 5.  Emphysema (ICD10-J43.9). Electronically Signed   By: Richardean Sale M.D.   On: 08/29/2021 12:25   CT Cervical Spine Wo Contrast  Result Date: 08/29/2021 CLINICAL DATA:  Head and neck trauma due to a fall today. EXAM: CT HEAD WITHOUT CONTRAST CT CERVICAL SPINE WITHOUT CONTRAST TECHNIQUE: Multidetector CT imaging of the head and cervical spine was performed following the standard protocol without intravenous contrast. Multiplanar CT image reconstructions of the cervical spine were also generated. RADIATION DOSE REDUCTION: This exam was performed according to the departmental dose-optimization program which includes automated exposure control, adjustment of the mA and/or kV according to patient size and/or use of iterative reconstruction technique. COMPARISON:  CT head 06/07/2019. FINDINGS: CT HEAD FINDINGS Brain: The patient's head is tilted in the CT gantry. There is no evidence of acute intracranial hemorrhage, mass lesion, brain edema or extra-axial fluid collection. There is generalized atrophy with prominence of the ventricles and subarachnoid spaces. Mild chronic small vessel ischemic changes in the periventricular white matter are unchanged. There is no CT evidence of acute cortical infarction. Vascular: Prominent intracranial vascular calcifications. No  hyperdense vessel identified. Skull: Negative for fracture or focal lesion. Sinuses/Orbits: The visualized paranasal sinuses and mastoid air cells are clear. No orbital abnormalities are seen. Other: None. CT CERVICAL SPINE FINDINGS Alignment: There is a convex right scoliosis with a mild degenerative anterolisthesis at C5-6, C6-7 and C7-T1. Skull base and vertebrae: No evidence of acute cervical spine fracture or traumatic subluxation. There is multilevel spondylosis. Soft tissues and spinal canal: No prevertebral fluid or swelling. No visible canal hematoma. Disc levels: No large disc herniation identified. There is multilevel spondylosis with uncinate spurring and facet hypertrophy. Upper chest: Severe emphysema noted at both lung apices. Other: Bilateral carotid atherosclerosis. IMPRESSION: 1. No acute intracranial or calvarial findings. 2. No evidence of acute cervical spine fracture, traumatic subluxation or static signs of instability. 3. Intracranial atrophy and mild chronic small vessel ischemic changes. 4. 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 5.  Emphysema (ICD10-J43.9). Electronically Signed   By: Richardean Sale M.D.   On: 08/29/2021 12:25    Procedures Procedures    Medications Ordered in ED Medications  LORazepam (ATIVAN) injection 0-4 mg (0 mg Intravenous Not Given 08/29/21 1339)    Or  LORazepam (ATIVAN) tablet 0-4 mg ( Oral See Alternative 08/29/21 1339)  LORazepam (ATIVAN) injection 0-4 mg (has no administration in time range)    Or  LORazepam (ATIVAN) tablet 0-4 mg (has no administration in time range)  thiamine tablet 100 mg ( Oral See Alternative 08/29/21 1334)    Or  thiamine (B-1) injection 100 mg (100 mg Intravenous Given 08/29/21 1334)  LORazepam (ATIVAN) injection 2 mg (2 mg Intravenous Given 08/29/21 1335)  ondansetron (ZOFRAN) injection 4 mg (4 mg Intravenous Given 08/29/21 1333)  sodium chloride 0.9 % bolus 1,000 mL (1,000 mLs  Intravenous New  Bag/Given 08/29/21 1341)  ipratropium-albuterol (DUONEB) 0.5-2.5 (3) MG/3ML nebulizer solution 3 mL (3 mLs Nebulization Given 08/29/21 1348)    ED Course/ Medical Decision Making/ A&P                           Medical Decision Making Amount and/or Complexity of Data Reviewed External Data Reviewed: labs, radiology, ECG and notes. Labs: ordered. Decision-making details documented in ED Course. Radiology: ordered and independent interpretation performed. Decision-making details documented in ED Course. ECG/medicine tests: ordered and independent interpretation performed. Decision-making details documented in ED Course.  Risk Prescription drug management. Decision regarding hospitalization. Diagnosis or treatment significantly limited by social determinants of health.   This is a 81 y.o. male with a PMH of hypertension, COPD on 3 L nasal cannula at home, atrial fibrillation, alcohol use disorder who presents to the ED for evaluation after having two falls this week and having persistent left rib cage pain.  Vitals are stable. From history, it sounds like patient has also been having persistent nausea and vomiting and abdominal pain noted for the past week as well as increased alcohol usage that may be contributing to his falls.  On exam, patient is ill-appearing, appears malnourished. Not tolerating PO intake over the past week.  He is having persistent dry heaving spells, pretty significant epigastric and right upper quadrant tenderness to palpation.  No guarding or rigidity.    Initial imaging was obtained while patient was in triage.  It was unclear whether patient hit his head during his falls or if he is still on anticoagulation, so obtained CT imaging.  Left rib plain films are negative for acute fracture. Labs are notable for hyponatremia to 131, otherwise normal.  We will add on abdominal labs.  I personally reviewed all laboratory work and imaging. Abnormal results outlined below. CT  head and cervical spine are negative for acute intracranial abnormality or C-spine fracture. Lipase negative, ETOH normal, Hepatic function panel reassuring, Ammonia pending.   Gave breathing treatment since patient is feeling short of breath. Increased home O2 to 4L. Following interventions, patient continues to not be able to tolerate PO Intake. Increased usage of home O2. Plan to admit for failure to thrive, worsening COPD, and decreased PO intake.  61: [facility] hospitalist to accept patient for admission.  I have seen and evaluated this patient in conjunction with my attending physician who agrees and has made changes to the plan accordingly.  Portions of this note were generated with Lobbyist. Dictation errors may occur despite best attempts at proofreading.   Final Clinical Impression(s) / ED Diagnoses Final diagnoses:  Failure to thrive in adult  COPD exacerbation (HCC)  Nausea and vomiting, unspecified vomiting type  Decreased oral intake    Rx / DC Orders ED Discharge Orders     None         Adolphus Birchwood, PA-C 08/29/21 1600    Isla Pence, MD 08/29/21 1609

## 2021-08-29 NOTE — ED Provider Triage Note (Signed)
Emergency Medicine Provider Triage Evaluation Note  JP EASTHAM , a 81 y.o. male  was evaluated in triage.  Pt complains of left-sided rib and abdomen pain.  Patient is a poor historian.  He is debilitated.  Patient states that he fell twice Friday and Saturday.  He complains of severe pain on the left side of his chest wall and abdomen.  He states "I can't breathe good. " patient is on oxygen  Review of Systems  Positive: Fall Negative: Bruising  Physical Exam  BP 128/84 (BP Location: Left Arm)    Pulse 98    Temp 98.7 F (37.1 C) (Oral)    Resp 16    SpO2 97%  Gen:   Awake, no distress   Resp:  Poor effort MSK:   Moves extremities without difficulty  Other:  Exquisite tenderness over the left lateral rib cage and flank area, no swelling or bruising noted  Medical Decision Making  Medically screening exam initiated at 9:38 AM.  Appropriate orders placed.  DEVYN SHEERIN was informed that the remainder of the evaluation will be completed by another provider, this initial triage assessment does not replace that evaluation, and the importance of remaining in the ED until their evaluation is complete.  Orders placed   Arthor Captain, PA-C 08/29/21 5329

## 2021-08-30 LAB — CBC
HCT: 33.2 % — ABNORMAL LOW (ref 39.0–52.0)
Hemoglobin: 11.2 g/dL — ABNORMAL LOW (ref 13.0–17.0)
MCH: 29.6 pg (ref 26.0–34.0)
MCHC: 33.7 g/dL (ref 30.0–36.0)
MCV: 87.6 fL (ref 80.0–100.0)
Platelets: 267 10*3/uL (ref 150–400)
RBC: 3.79 MIL/uL — ABNORMAL LOW (ref 4.22–5.81)
RDW: 16.9 % — ABNORMAL HIGH (ref 11.5–15.5)
WBC: 7.7 10*3/uL (ref 4.0–10.5)
nRBC: 0 % (ref 0.0–0.2)

## 2021-08-30 NOTE — ED Notes (Signed)
Lunch Ordered °

## 2021-08-30 NOTE — ED Notes (Signed)
Placed Breakfast Order 

## 2021-08-30 NOTE — ED Notes (Signed)
Hooked patient up to the monitor patient is resting with call bell in reach 

## 2021-08-30 NOTE — Evaluation (Signed)
Physical Therapy Evaluation Patient Details Name: Marc Morris MRN: [id number] DOB: 29-May-1941 Today's Date: 08/30/2021  History of Present Illness  Pt is a 29 u/o male admitted secondary to falls and COPD exacerbation. Pt with L sided rib pain, however, imaging negative. PMH includes COPD, alcohol abuse, HTN and a fib.  Clinical Impression  Pt admitted secondary to problem above with deficits below. Pt requiring min A for bed mobility, however, pt refusing further mobility secondary to pain. Very slow processing and decreased safety awareness. Pt with hx of multiple falls and unsure of caregiver support. Recommending SNF level therapies at d/c to address current deficits. However, if pt decides to go home will need to ensure family able to assist appropriately and will need max HH services. Will continue to follow acutely.        Recommendations for follow up therapy are one component of a multi-disciplinary discharge planning process, led by the attending physician.  Recommendations may be updated based on patient status, additional functional criteria and insurance authorization.  Follow Up Recommendations Skilled nursing-short term rehab (<3 hours/day)    Assistance Recommended at Discharge Frequent or constant Supervision/Assistance  Patient can return home with the following  A little help with walking and/or transfers;A little help with bathing/dressing/bathroom    Equipment Recommendations Wheelchair cushion (measurements PT);Wheelchair (measurements PT)  Recommendations for Other Services       Functional Status Assessment Patient has had a recent decline in their functional status and demonstrates the ability to make significant improvements in function in a reasonable and predictable amount of time.     Precautions / Restrictions Precautions Precautions: Fall Restrictions Weight Bearing Restrictions: No      Mobility  Bed Mobility Overal bed mobility: Needs  Assistance Bed Mobility: Supine to Sit, Sit to Supine     Supine to sit: Min assist Sit to supine: Supervision   General bed mobility comments: Min A for trunk assist to come to sitting. Pt reporting increased pain and refused further mobility.    Transfers                        Ambulation/Gait                  Stairs            Wheelchair Mobility    Modified Rankin (Stroke Patients Only)       Balance Overall balance assessment: Needs assistance Sitting-balance support: No upper extremity supported, Feet supported Sitting balance-Leahy Scale: Fair                                       Pertinent Vitals/Pain Pain Assessment Pain Assessment: Faces Faces Pain Scale: Hurts whole lot Pain Location: L ribs Pain Descriptors / Indicators: Grimacing, Guarding Pain Intervention(s): Limited activity within patient's tolerance, Monitored during session, Repositioned    Home Living Family/patient expects to be discharged to:: Private residence Living Arrangements: Non-relatives/Friends Available Help at Discharge: Friend(s) Type of Home: House Home Access: Stairs to enter Entrance Stairs-Rails: Right Entrance Stairs-Number of Steps: 2-3   Home Layout: One level Home Equipment: Conservation officer, nature (2 wheels);Cane - single point      Prior Function Prior Level of Function : Needs assist             Mobility Comments: Uses cane vs rw depending on the day ADLs Comments: friend  assists with bathing and dressing as needed.     Hand Dominance        Extremity/Trunk Assessment   Upper Extremity Assessment Upper Extremity Assessment: Defer to OT evaluation    Lower Extremity Assessment Lower Extremity Assessment: Generalized weakness    Cervical / Trunk Assessment Cervical / Trunk Assessment: Kyphotic  Communication   Communication: No difficulties  Cognition Arousal/Alertness: Awake/alert Behavior During Therapy: Flat  affect Overall Cognitive Status: No family/caregiver present to determine baseline cognitive functioning                                 General Comments: Pt with slow processing. Responded to most questions, but some he would not respond to.        General Comments General comments (skin integrity, edema, etc.): No family present    Exercises     Assessment/Plan    PT Assessment Patient needs continued PT services  PT Problem List Decreased strength;Decreased activity tolerance;Decreased balance;Decreased mobility;Decreased knowledge of use of DME;Decreased safety awareness;Decreased knowledge of precautions       PT Treatment Interventions DME instruction;Gait training;Functional mobility training;Stair training;Therapeutic activities;Therapeutic exercise;Balance training;Patient/family education    PT Goals (Current goals can be found in the Care Plan section)  Acute Rehab PT Goals Patient Stated Goal: to decrease pain PT Goal Formulation: With patient Time For Goal Achievement: 09/13/21 Potential to Achieve Goals: Good    Frequency Min 2X/week     Co-evaluation               AM-PAC PT "6 Clicks" Mobility  Outcome Measure Help needed turning from your back to your side while in a flat bed without using bedrails?: A Little Help needed moving from lying on your back to sitting on the side of a flat bed without using bedrails?: A Little Help needed moving to and from a bed to a chair (including a wheelchair)?: A Lot Help needed standing up from a chair using your arms (e.g., wheelchair or bedside chair)?: A Lot Help needed to walk in hospital room?: A Lot Help needed climbing 3-5 steps with a railing? : Total 6 Click Score: 13    End of Session Equipment Utilized During Treatment: Gait belt Activity Tolerance: Patient limited by pain Patient left: in bed;with call bell/phone within reach Nurse Communication: Mobility status PT Visit Diagnosis:  Unsteadiness on feet (R26.81);Muscle weakness (generalized) (M62.81);Difficulty in walking, not elsewhere classified (R26.2);History of falling (Z91.81)    Time: [id number] PT Time Calculation (min) (ACUTE ONLY): 12 min   Charges:   PT Evaluation $PT Eval Moderate Complexity: 1 Mod          Reuel Derby, PT, DPT  Acute Rehabilitation Services  Pager: [phone] Office: [phone]   Rudean Hitt 08/30/2021, 1:36 PM

## 2021-08-30 NOTE — Progress Notes (Signed)
[facility]  NAVARRO BRANCATO [id number] DOB: 1941/03/26 DOA: 08/29/2021  HPI: DESHON VERHOEVEN is a 81 y.o. 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  Patient is poor historian, and I tried to reach patient's family: Wife's phone number does not work and left a message patient niece.  Patient provided some of the history and the rest of the history supplemented by ER record.  Patient reported that for 4 to 5 days he has had productive cough with yellowish sputum production, episode of chills but no fever.  He was admitted recently has started binge drinking, as much as to 240 ounces of beer a day.  Last drink was last night.  This morning, patient started to have tremors and unsteady gait and fell to the left side, denied any loss of consciousness or head injuries.  He also has been using breathing treatment around-the-clock with little help and then come to the hospital.  ED Course: Patient was stabilized on 4 L oxygen, no tachycardia no hypotension.  Afebrile.  Chest x-ray showed no acute infiltrates or fractures of the ribs.  CT head and neck negative for acute findings.  Physical exam found patient in active alcohol withdrawal with elevated CIWA, IV benzos given the patient was started on CIWA protocol.  IV antibiotics started in ED along with breathing treatment.  Subjective feeling a little better this morning not as shaky  Physical Exam: Vitals:   08/30/21 0415 08/30/21 0745 08/30/21 0900 08/30/21 0902  BP: 103/66 (!) 174/86 (!) 171/95 (!) 171/95  Pulse: 71 74 68 62  Resp: 20 12 17    Temp:  98.6 F (37 C)    TempSrc:  Oral    SpO2: 96% 100% 99%     Constitutional: NAD, calm, comfortable Vitals:   08/30/21 0415 08/30/21 0745 08/30/21 0900 08/30/21 0902  BP: 103/66 (!) 174/86 (!) 171/95 (!) 171/95   Pulse: 71 74 68 62  Resp: 20 12 17    Temp:  98.6 F (37 C)    TempSrc:  Oral    SpO2: 96% 100% 99%    Eyes: PERRL, lids and conjunctivae normal ENMT: Mucous membranes are moist. Posterior pharynx clear of any exudate or lesions.Normal dentition.  Neck: normal, supple, no masses, no thyromegaly Respiratory: Clear to auscultation bilaterally no wheezes rhonchi or rales present normal respiratory effort no accessory muscle use.  Cardiovascular: Regular rate and rhythm, no murmurs / rubs / gallops. No extremity edema. 2+ pedal pulses. No carotid bruits.  Abdomen: no tenderness, no masses palpated. No hepatosplenomegaly. Bowel sounds positive.  Musculoskeletal: no clubbing / cyanosis. No joint deformity upper and lower extremities. Good ROM, no contractures. Normal muscle tone.  Skin: no rashes, lesions, ulcers. No induration Neurologic: CN 2-12 grossly intact. Sensation intact, DTR normal. Strength 5/5 in all 4.  Psychiatric: Normal judgment and insight. Alert and oriented x 3. Normal mood.     Labs on Admission: I have personally reviewed following labs and imaging studies  CBC: Recent Labs  Lab 08/29/21 0949 08/30/21 0729  WBC 6.1 7.7  HGB 13.2 11.2*  HCT 41.0 33.2*  MCV 86.3 87.6  PLT 231 [id number]   Basic Metabolic Panel: Recent Labs  Lab 08/29/21 0949  NA 131*  K 5.1  CL 92*  CO2 23  GLUCOSE 108*  BUN 9  CREATININE 0.83  CALCIUM 9.6  GFR: CrCl cannot be calculated (Unknown ideal weight.). Liver Function Tests: Recent Labs  Lab 08/29/21 1218  AST 39  ALT 28  ALKPHOS 84  BILITOT 0.9  PROT 8.0  ALBUMIN 4.0   Recent Labs  Lab 08/29/21 1218  LIPASE 26   No results for input(s): AMMONIA in the last 168 hours. Coagulation Profile: Recent Labs  Lab 08/29/21 1218  INR 0.8   Cardiac Enzymes: No results for input(s): CKTOTAL, CKMB, CKMBINDEX, TROPONINI in the last 168 hours. BNP (last 3 results) No results for input(s): PROBNP in the last 8760  hours. HbA1C: No results for input(s): HGBA1C in the last 72 hours. CBG: No results for input(s): GLUCAP in the last 168 hours. Lipid Profile: No results for input(s): CHOL, HDL, LDLCALC, TRIG, CHOLHDL, LDLDIRECT in the last 72 hours. Thyroid Function Tests: No results for input(s): TSH, T4TOTAL, FREET4, T3FREE, THYROIDAB in the last 72 hours. Anemia Panel: No results for input(s): VITAMINB12, FOLATE, FERRITIN, TIBC, IRON, RETICCTPCT in the last 72 hours. Urine analysis:    Component Value Date/Time   COLORURINE YELLOW 08/29/2021 1830   APPEARANCEUR CLEAR 08/29/2021 1830   LABSPEC 1.020 08/29/2021 1830   PHURINE 6.0 08/29/2021 1830   GLUCOSEU NEGATIVE 08/29/2021 1830   HGBUR TRACE (A) 08/29/2021 1830   BILIRUBINUR NEGATIVE 08/29/2021 1830   KETONESUR 15 (A) 08/29/2021 1830   PROTEINUR NEGATIVE 08/29/2021 1830   NITRITE NEGATIVE 08/29/2021 1830   LEUKOCYTESUR NEGATIVE 08/29/2021 1830    Radiological Exams on Admission: DG Ribs Unilateral W/Chest Left  Result Date: 08/29/2021 CLINICAL DATA:  Left lateral rib pain after fall last Thursday. EXAM: LEFT RIBS AND CHEST - 3+ VIEW COMPARISON:  Chest x-ray dated July 31, 2021. FINDINGS: No fracture or other bone lesions are seen involving the ribs. The heart size and mediastinal contours are within normal limits. Similar emphysematous changes with chronic scarring at the lung bases. No focal consolidation, pleural effusion, or pneumothorax. Air within the colon under the right hemidiaphragm. IMPRESSION: 1. No acute rib fracture. 2. COPD. No acute cardiopulmonary disease. Electronically Signed   By: Titus Dubin M.D.   On: 08/29/2021 10:13   CT Head Wo Contrast  Result Date: 08/29/2021 CLINICAL DATA:  Head and neck trauma due to a fall today. EXAM: CT HEAD WITHOUT CONTRAST CT CERVICAL SPINE WITHOUT CONTRAST TECHNIQUE: Multidetector CT imaging of the head and cervical spine was performed following the standard protocol without  intravenous contrast. Multiplanar CT image reconstructions of the cervical spine were also generated. RADIATION DOSE REDUCTION: This exam was performed according to the departmental dose-optimization program which includes automated exposure control, adjustment of the mA and/or kV according to patient size and/or use of iterative reconstruction technique. COMPARISON:  CT head 06/07/2019. FINDINGS: CT HEAD FINDINGS Brain: The patient's head is tilted in the CT gantry. There is no evidence of acute intracranial hemorrhage, mass lesion, brain edema or extra-axial fluid collection. There is generalized atrophy with prominence of the ventricles and subarachnoid spaces. Mild chronic small vessel ischemic changes in the periventricular white matter are unchanged. There is no CT evidence of acute cortical infarction. Vascular: Prominent intracranial vascular calcifications. No hyperdense vessel identified. Skull: Negative for fracture or focal lesion. Sinuses/Orbits: The visualized paranasal sinuses and mastoid air cells are clear. No orbital abnormalities are seen. Other: None. CT CERVICAL SPINE FINDINGS Alignment: There is a convex right scoliosis with a mild degenerative anterolisthesis at C5-6, C6-7 and C7-T1. Skull base and vertebrae: No evidence of acute cervical spine fracture or traumatic  subluxation. There is multilevel spondylosis. Soft tissues and spinal canal: No prevertebral fluid or swelling. No visible canal hematoma. Disc levels: No large disc herniation identified. There is multilevel spondylosis with uncinate spurring and facet hypertrophy. Upper chest: Severe emphysema noted at both lung apices. Other: Bilateral carotid atherosclerosis. IMPRESSION: 1. No acute intracranial or calvarial findings. 2. No evidence of acute cervical spine fracture, traumatic subluxation or static signs of instability. 3. Intracranial atrophy and mild chronic small vessel ischemic changes. 4. Multilevel cervical spondylosis  associated with a scoliosis. 5.  Emphysema (ICD10-J43.9). Electronically Signed   By: Richardean Sale M.D.   On: 08/29/2021 12:25   CT Cervical Spine Wo Contrast  Result Date: 08/29/2021 CLINICAL DATA:  Head and neck trauma due to a fall today. EXAM: CT HEAD WITHOUT CONTRAST CT CERVICAL SPINE WITHOUT CONTRAST TECHNIQUE: Multidetector CT imaging of the head and cervical spine was performed following the standard protocol without intravenous contrast. Multiplanar CT image reconstructions of the cervical spine were also generated. RADIATION DOSE REDUCTION: This exam was performed according to the departmental dose-optimization program which includes automated exposure control, adjustment of the mA and/or kV according to patient size and/or use of iterative reconstruction technique. COMPARISON:  CT head 06/07/2019. FINDINGS: CT HEAD FINDINGS Brain: The patient's head is tilted in the CT gantry. There is no evidence of acute intracranial hemorrhage, mass lesion, brain edema or extra-axial fluid collection. There is generalized atrophy with prominence of the ventricles and subarachnoid spaces. Mild chronic small vessel ischemic changes in the periventricular white matter are unchanged. There is no CT evidence of acute cortical infarction. Vascular: Prominent intracranial vascular calcifications. No hyperdense vessel identified. Skull: Negative for fracture or focal lesion. Sinuses/Orbits: The visualized paranasal sinuses and mastoid air cells are clear. No orbital abnormalities are seen. Other: None. CT CERVICAL SPINE FINDINGS Alignment: There is a convex right scoliosis with a mild degenerative anterolisthesis at C5-6, C6-7 and C7-T1. Skull base and vertebrae: No evidence of acute cervical spine fracture or traumatic subluxation. There is multilevel spondylosis. Soft tissues and spinal canal: No prevertebral fluid or swelling. No visible canal hematoma. Disc levels: No large disc herniation identified. There is  multilevel spondylosis with uncinate spurring and facet hypertrophy. Upper chest: Severe emphysema noted at both lung apices. Other: Bilateral carotid atherosclerosis. IMPRESSION: 1. No acute intracranial or calvarial findings. 2. No evidence of acute cervical spine fracture, traumatic subluxation or static signs of instability. 3. Intracranial atrophy and mild chronic small vessel ischemic changes. 4. 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 5.  Emphysema (ICD10-J43.9). Electronically Signed   By: Richardean Sale M.D.   On: 08/29/2021 12:25    EKG: Ordered  Assessment/Plan Principal Problem:   COPD (chronic obstructive pulmonary disease) (HCC)   Acute on chronic hypoxic respite failure secondary to acute COPD exacerbation -Escalate antibiotics coverage to doxycycline twice daily -Short course of p.o. prednisone -Breathing treatment -Robitussin -Flutter valve and incentive spirometry -Continue Daliresp and montelukast  Alcohol withdrawal -CIWA protocol with as needed benzos. -banana bag x1 -Overall seems stable and improved  HTN -Continue lisinopril and metoprolol.  Anxiety/depression -Continue SSRI  PAF -CT head and neck reassuring H&H stable, continue Xarelto  Frequent falls -Likely secondary to alcohol intoxication.  CIWA protocol, PT evaluation pending    Hailie Searight A MD [facility] Pager [phone]  08/30/2021, 10:41 AM   Patient ID: Jeanine Luz, male   DOB: 12-19-1940, 81 y.o.   MRN: [id number]

## 2021-08-30 NOTE — ED Notes (Signed)
Pt cleaned up of urinary incontinence. Jeans taken off and placed in belongings bag. Fresh linens, gown, chux pad placed under pt.

## 2021-08-31 ENCOUNTER — Other Ambulatory Visit: Payer: Self-pay

## 2021-08-31 NOTE — Evaluation (Signed)
Occupational Therapy Evaluation Patient Details Name: Marc Morris MRN: [id number] DOB: [birth year] Today's Date: 08/31/2021   History of Present Illness Pt is a 81 y/o male admitted secondary to falls and COPD exacerbation. Pt with L sided rib pain, however, imaging negative. PMH includes COPD, alcohol abuse, HTN and a fib.   Clinical Impression   Pt is a poor historian, states he either uses a walker or a cane and is independent in ADLs and IADLs at baseline. Pt demonstrated ability to self feed with set up and to wash his hands, but stated he could not brush his teeth because his arms didn't work well. Pt with B UE tremor, generalized weakness and pain in L rib area. He was only agreeable to sitting EOB. Recommending SNF. Pt vague when asked whether he has support at home.      Recommendations for follow up therapy are one component of a multi-disciplinary discharge planning process, led by the attending physician.  Recommendations may be updated based on patient status, additional functional criteria and insurance authorization.   Follow Up Recommendations  Skilled nursing-short term rehab (<3 hours/day)    Assistance Recommended at Discharge Frequent or constant Supervision/Assistance  Patient can return home with the following A little help with walking and/or transfers;A lot of help with bathing/dressing/bathroom;Assistance with cooking/housework;Direct supervision/assist for medications management;Direct supervision/assist for financial management;Assist for transportation;Help with stairs or ramp for entrance    Functional Status Assessment  Patient has had a recent decline in their functional status and/or demonstrates limited ability to make significant improvements in function in a reasonable and predictable amount of time  Equipment Recommendations  BSC/3in1    Recommendations for Other Services       Precautions / Restrictions Precautions Precautions: Fall       Mobility Bed Mobility Overal bed mobility: Needs Assistance Bed Mobility: Sidelying to Sit, Sit to Sidelying   Sidelying to sit: Min assist     Sit to sidelying: Min guard General bed mobility comments: assist to raise trunk    Transfers                   General transfer comment: refused      Balance Overall balance assessment: Needs assistance Sitting-balance support: No upper extremity supported, Feet supported Sitting balance-Leahy Scale: Fair                                     ADL either performed or assessed with clinical judgement   ADL Overall ADL's : Needs assistance/impaired Eating/Feeding: Set up;Bed level   Grooming: Wash/dry hands;Set up;Bed level   Upper Body Bathing: Moderate assistance;Sitting   Lower Body Bathing: Maximal assistance;Sitting/lateral leans   Upper Body Dressing : Moderate assistance;Sitting   Lower Body Dressing: Maximal assistance;Sitting/lateral leans                 General ADL Comments: Limited participation due to pain.     Vision Patient Visual Report: No change from baseline       Perception     Praxis      Pertinent Vitals/Pain Pain Assessment Pain Assessment: Faces Faces Pain Scale: Hurts whole lot Pain Location: L ribs Pain Descriptors / Indicators: Grimacing, Guarding, Sharp Pain Intervention(s): Monitored during session, Repositioned     Hand Dominance     Extremity/Trunk Assessment Upper Extremity Assessment Upper Extremity Assessment: Generalized weakness;Difficult to assess due to impaired cognition (tremulous)  Lower Extremity Assessment Lower Extremity Assessment: Defer to PT evaluation   Cervical / Trunk Assessment Cervical / Trunk Assessment: Kyphotic   Communication Communication Communication: HOH   Cognition Arousal/Alertness: Awake/alert Behavior During Therapy: Flat affect Overall Cognitive Status: No family/caregiver present to determine baseline  cognitive functioning                                 General Comments: pt with inconsistent ability to respond to questions, slow response speed, hearing likely a factor, states he can't brush his teeth because he can't use his hand, but was able to self feed     General Comments       Exercises     Shoulder Instructions      Home Living Family/patient expects to be discharged to:: Private residence Living Arrangements: Non-relatives/Friends Available Help at Discharge: Friend(s) Type of Home: House Home Access: Stairs to enter [employer] of Steps: 2-3 Entrance Stairs-Rails: Right Home Layout: One level     Bathroom Shower/Tub: Chief Strategy Officer: Standard     Home Equipment: Agricultural consultant (2 wheels);Cane - single point          Prior Functioning/Environment Prior Level of Function : Needs assist             Mobility Comments: Uses cane vs rw depending on the day ADLs Comments: reports independence with ADL and IADL        OT Problem List: Decreased strength;Decreased activity tolerance;Impaired balance (sitting and/or standing);Decreased cognition;Decreased coordination;Decreased knowledge of use of DME or AE;Pain      OT Treatment/Interventions: Self-care/ADL training;DME and/or AE instruction;Cognitive remediation/compensation;Therapeutic activities;Patient/family education;Balance training    OT Goals(Current goals can be found in the care plan section) Acute Rehab OT Goals OT Goal Formulation: With patient Time For Goal Achievement: 09/14/21 Potential to Achieve Goals: Good ADL Goals Pt Will Perform Grooming: with min assist;standing Pt Will Perform Upper Body Dressing: with min assist;sitting Pt Will Perform Lower Body Dressing: with min assist;sit to/from stand Pt Will Transfer to Toilet: with min assist;ambulating;bedside commode Pt Will Perform Toileting - Clothing Manipulation and hygiene: with min  assist;sit to/from stand Additional ADL Goal #1: Pt will perform bed mobility modified independently in preparation for ADL. Additional ADL Goal #2: Pt will participate in formal cognitive screening.  OT Frequency: Min 2X/week    Co-evaluation              AM-PAC OT "6 Clicks" Daily Activity     Outcome Measure Help from another person eating meals?: A Little Help from another person taking care of personal grooming?: A Little Help from another person toileting, which includes using toliet, bedpan, or urinal?: A Lot Help from another person bathing (including washing, rinsing, drying)?: A Lot Help from another person to put on and taking off regular upper body clothing?: A Lot Help from another person to put on and taking off regular lower body clothing?: A Lot 6 Click Score: 14   End of Session Equipment Utilized During Treatment: Oxygen  Activity Tolerance: Patient limited by pain;Patient limited by fatigue Patient left: in bed;with call bell/phone within reach;with bed alarm set  OT Visit Diagnosis: Pain;Muscle weakness (generalized) (M62.81);Other symptoms and signs involving cognitive function                Time: 7793-9030 OT Time Calculation (min): 21 min Charges:  OT General Charges $OT Visit: 1 Visit OT  Evaluation $OT Eval Moderate Complexity: 1 Mod  Martie RoundJulie Shateria Paternostro, OTR/L Acute Rehabilitation Services Pager: [phone] Office: [phone]   Evern BioMayberry, Jacoba Cherney Lynn 08/31/2021, 9:36 AM

## 2021-08-31 NOTE — Progress Notes (Signed)
PROGRESS NOTE  Marc Morris TRZ:[id number] DOB: Mar 05, 1941 DOA: 08/29/2021 PCP: Galvin Proffer, MD   LOS: 2 days   Brief Narrative / Interim history: Marc Morris is a 81 y.o. 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  He also reported several days of productive cough.  Has recent episodes of binge drinking, last drink was the night prior to admission.  Subjective / 24h Interval events: Doing well this morning, denies any shortness of breath.  Tremulous.  Assessment & Plan: Principal problem Acute on chronic hypoxic respiratory failure due to acute COPD exacerbation, underlying chronic hypoxic respiratory failure on 3 L at home-patient had significant wheezing, productive cough on admission and shortness of breath.  He was placed on doxycycline, steroids and breathing treatments.  Appears to be improving, continue.  Attempt to wean off to home 3 L  Active problems Alcohol withdrawal, ETOH abuse-still appears to be withdrawing this morning, continue CIWA protocol, CIWA 19 this morning  Essential hypertension-continue metoprolol, lisinopril  Anxiety/depression-continue SSRI  Paroxysmal A. fib-continue metoprolol, anticoagulation with Xarelto  Frequent falls, deconditioning-OT evaluated today, recommends SNF.  Consult social worker  Normocytic anemia-mild, perhaps related to EtOH use, also dilutional component  Hyponatremia-mild, monitor  Scheduled Meds:  donepezil  5 mg Oral Daily   doxycycline  100 mg Oral Q12H   FLUoxetine  20 mg Oral Daily   folic acid  1 mg Oral Daily   guaiFENesin  1,200 mg Oral BID   lisinopril  5 mg Oral Daily   LORazepam  0-4 mg Intravenous Q6H   Or   LORazepam  0-4 mg Oral Q6H   LORazepam  0-4 mg Intravenous Q12H   Or   LORazepam  0-4 mg Oral Q12H   metoprolol tartrate   25 mg Oral BID   mirtazapine  15 mg Oral Daily   mometasone-formoterol  2 puff Inhalation BID   montelukast  10 mg Oral Daily   pantoprazole  40 mg Oral Daily   pravastatin  40 mg Oral Daily   predniSONE  40 mg Oral Q breakfast   rivaroxaban  20 mg Oral Daily   roflumilast  500 mcg Oral Daily   tamsulosin  0.4 mg Oral Daily   thiamine  100 mg Oral Daily   Or   thiamine  100 mg Intravenous Daily   Continuous Infusions: PRN Meds:.acetaminophen **OR** acetaminophen, albuterol, ipratropium-albuterol, ondansetron **OR** ondansetron (ZOFRAN) IV  Diet Orders (From admission, onward)     Start     Ordered   08/29/21 1700  Diet Heart Room service appropriate? Yes; Fluid consistency: Thin  Diet effective now       Question Answer Comment  Room service appropriate? Yes   Fluid consistency: Thin      08/29/21 1700            DVT prophylaxis:  rivaroxaban (XARELTO) tablet 20 mg   Lab Results  Component Value Date   PLT 267 08/30/2021      Code Status: Full Code  Family Communication: No family at bedside  Status is: Inpatient  Remains inpatient appropriate because: CIWA, needs SNF  Level of care: Telemetry Medical  Consultants:  none  Procedures:  none  Microbiology  none  Antimicrobials: none    Objective: Vitals:   08/31/21 0300 08/31/21 0310 08/31/21 0748 08/31/21 0833  BP:  Marland Kitchen)  148/90 138/76   Pulse:  71 67   Resp:  18 17   Temp:  97.7 F (36.5 C) 98.2 F (36.8 C)   TempSrc:  Oral Oral   SpO2: 100% 100% 97% 97%    Intake/Output Summary (Last 24 hours) at 08/31/2021 1035 Last data filed at 08/31/2021 0800 Gross per 24 hour  Intake 420 ml  Output --  Net 420 ml   Wt Readings from Last 3 Encounters:  02/12/19 51.8 kg  09/29/11 57.2 kg    Examination:  Constitutional: Tremulous Eyes: no scleral icterus ENMT: Mucous membranes are moist.  Neck: normal, supple Respiratory: clear to auscultation bilaterally, no wheezing, no crackles.   Cardiovascular: Regular rate and rhythm, no murmurs / rubs / gallops. No LE edema.  Abdomen: non distended, no tenderness. Bowel sounds positive.  Musculoskeletal: no clubbing / cyanosis.  Skin: no rashes Neurologic: non focal    Data Reviewed: I have independently reviewed following labs and imaging studies   CBC Recent Labs  Lab 08/29/21 0949 08/30/21 0729  WBC 6.1 7.7  HGB 13.2 11.2*  HCT 41.0 33.2*  PLT 231 267  MCV 86.3 87.6  MCH 27.8 29.6  MCHC 32.2 33.7  RDW 15.8* 16.9*    Recent Labs  Lab 08/29/21 0949 08/29/21 1218  NA 131*  --   K 5.1  --   CL 92*  --   CO2 23  --   GLUCOSE 108*  --   BUN 9  --   CREATININE 0.83  --   CALCIUM 9.6  --   AST  --  39  ALT  --  28  ALKPHOS  --  84  BILITOT  --  0.9  ALBUMIN  --  4.0  INR  --  0.8    ------------------------------------------------------------------------------------------------------------------ No results for input(s): CHOL, HDL, LDLCALC, TRIG, CHOLHDL, LDLDIRECT in the last 72 hours.  No results found for: HGBA1C ------------------------------------------------------------------------------------------------------------------ No results for input(s): TSH, T4TOTAL, T3FREE, THYROIDAB in the last 72 hours.  Invalid input(s): FREET3  Cardiac Enzymes No results for input(s): CKMB, TROPONINI, MYOGLOBIN in the last 168 hours.  Invalid input(s): CK ------------------------------------------------------------------------------------------------------------------ No results found for: BNP  CBG: No results for input(s): GLUCAP in the last 168 hours.  No results found for this or any previous visit (from the past 240 hour(s)).   Radiology Studies: No results found.   Marzetta Board, MD, PhD [facility]  Between 7 am - 7 pm I am available, please contact me via Amion (for emergencies) or Securechat (non urgent messages)  Between 7 pm - 7 am I am not available, please contact night coverage  MD/APP via Amion

## 2021-08-31 NOTE — Hospital Course (Signed)
Marc Morris is a 81 y.o. 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  He also reported several days of productive cough.  Has recent episodes of binge drinking, last drink was the night prior to admission.

## 2021-09-01 LAB — COMPREHENSIVE METABOLIC PANEL
ALT: 18 U/L (ref 0–44)
AST: 22 U/L (ref 15–41)
Albumin: 3.3 g/dL — ABNORMAL LOW (ref 3.5–5.0)
Alkaline Phosphatase: 71 U/L (ref 38–126)
Anion gap: 13 (ref 5–15)
BUN: 24 mg/dL — ABNORMAL HIGH (ref 8–23)
CO2: 25 mmol/L (ref 22–32)
Calcium: 9.1 mg/dL (ref 8.9–10.3)
Chloride: 97 mmol/L — ABNORMAL LOW (ref 98–111)
Creatinine, Ser: 1.15 mg/dL (ref 0.61–1.24)
GFR, Estimated: >60 mL/min (ref >60)
Glucose, Bld: 102 mg/dL — ABNORMAL HIGH (ref 70–99)
Potassium: 3.9 mmol/L (ref 3.5–5.1)
Sodium: 135 mmol/L (ref 135–145)
Total Bilirubin: 0.4 mg/dL (ref 0.3–1.2)
Total Protein: 6.4 g/dL — ABNORMAL LOW (ref 6.5–8.1)

## 2021-09-01 LAB — CBC
HCT: 32.7 % — ABNORMAL LOW (ref 39.0–52.0)
Hemoglobin: 11.1 g/dL — ABNORMAL LOW (ref 13.0–17.0)
MCH: 28.9 pg (ref 26.0–34.0)
MCHC: 33.9 g/dL (ref 30.0–36.0)
MCV: 85.2 fL (ref 80.0–100.0)
Platelets: 251 10*3/uL (ref 150–400)
RBC: 3.84 MIL/uL — ABNORMAL LOW (ref 4.22–5.81)
RDW: 15.9 % — ABNORMAL HIGH (ref 11.5–15.5)
WBC: 9.3 10*3/uL (ref 4.0–10.5)
nRBC: 0 % (ref 0.0–0.2)

## 2021-09-01 MED ORDER — IPRATROPIUM-ALBUTEROL 0.5-2.5 (3) MG/3ML IN SOLN
3.0000 mL | Freq: Three times a day (TID) | RESPIRATORY_TRACT | Status: DC
  Administered 2021-09-01 – 2021-09-06 (×16): 3 mL via RESPIRATORY_TRACT
  Filled 2021-09-01 (×16): qty 3

## 2021-09-01 MED ORDER — LIDOCAINE 5 % EX PTCH
1.0000 | MEDICATED_PATCH | CUTANEOUS | Status: DC
  Administered 2021-09-01 – 2021-09-06 (×6): 1 via TRANSDERMAL
  Filled 2021-09-01 (×6): qty 1

## 2021-09-01 NOTE — Care Management Important Message (Signed)
Important Message  Patient Details  Name: Marc Morris MRN: [id number] Date of Birth: [birth year]   Medicare Important Message Given:  Yes     Sherilyn Banker 09/01/2021, 12:21 PM

## 2021-09-01 NOTE — NC FL2 (Signed)
[place] MEDICAID FL2 LEVEL OF CARE SCREENING TOOL     IDENTIFICATION  Patient Name: Marc Morris Birthdate: Apr 18, 1941 Sex: male Admission Date (Current Location): 08/29/2021  [facility] and [place] Number:  Producer, television/film/video and Address:  [facility], 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  02/12/2019   AF (paroxysmal atrial fibrillation) (HCC) 02/12/2019   Essential hypertension 02/12/2019   COPD (chronic obstructive pulmonary disease) (HCC) 02/12/2019   History of alcohol abuse 02/12/2019   Near syncope 02/12/2019   Falls 02/12/2019    Orientation RESPIRATION BLADDER Height & Weight     Self, Time, Place  O2 (3L of [place]) Continent Weight: 124 lb 6.4 oz (56.4 kg) Height:  5\' 3"  (160 cm)  BEHAVIORAL SYMPTOMS/MOOD NEUROLOGICAL BOWEL NUTRITION STATUS      Continent    AMBULATORY STATUS COMMUNICATION OF NEEDS Skin   Limited Assist Verbally Surgical wounds (Incision R knee)                       Personal Care Assistance Level of Assistance  Bathing, Feeding, Dressing Bathing Assistance: Limited assistance Feeding assistance: Independent Dressing Assistance: Limited assistance     Functional Limitations Info  Sight, Hearing, Speech Sight Info: Adequate Hearing Info: Impaired Speech Info: Adequate    SPECIAL CARE FACTORS FREQUENCY  OT (By licensed OT), PT (By licensed PT)      PT Frequency: 5x a week OT Frequency: 5x a week            Contractures Contractures Info: Not present    Additional Factors Info  Code Status, Allergies Code Status Info: Full Allergies Info: NKA           Current Medications (09/01/2021):  This is the current hospital active medication list Current Facility-Administered Medications  Medication Dose Route Frequency Provider Last Rate Last Admin   acetaminophen (TYLENOL) tablet 650 mg  650 mg Oral Q6H PRN 09/03/2021 T, MD   650 mg at 08/31/21 2307   Or   acetaminophen (TYLENOL) suppository 650 mg  650 mg Rectal Q6H PRN 2308 T, MD       albuterol (PROVENTIL) (2.5 MG/3ML) 0.083% nebulizer solution 2.5 mg  2.5 mg Inhalation Q6H PRN Mikey College T, MD   2.5 mg at 08/30/21 1558   donepezil (ARICEPT) tablet 5 mg  5 mg Oral Daily 09/01/21 T, MD   5 mg at 09/01/21 1007   doxycycline (VIBRA-TABS) tablet 100 mg  100 mg Oral Q12H 09/03/21 T, MD   100 mg at 09/01/21 1007   FLUoxetine (PROZAC) capsule 20 mg  20 mg Oral Daily 09/03/21 T, MD   20 mg at 09/01/21 1006   folic acid (FOLVITE) tablet 1 mg  1 mg Oral Daily 09/03/21 T, MD   1 mg at 09/01/21 1007   guaiFENesin (MUCINEX) 12 hr tablet 1,200 mg  1,200  mg Oral BID Mikey College T, MD   1,200 mg at 09/01/21 1007   ipratropium-albuterol (DUONEB) 0.5-2.5 (3) MG/3ML nebulizer solution 3 mL  3 mL Nebulization TID Leatha Gilding, MD   3 mL at 09/01/21 1451   lidocaine (LIDODERM) 5 % 1 patch  1 patch Transdermal Q24H Leatha Gilding, MD   1 patch at 09/01/21 1009   lisinopril (ZESTRIL) tablet 5 mg  5 mg Oral Daily Mikey College T, MD   5 mg at 09/01/21 1007   LORazepam (ATIVAN) injection 0-4 mg  0-4 mg Intravenous Q12H Loeffler, Grace C, PA-C       Or   LORazepam (ATIVAN) tablet 0-4 mg  0-4 mg Oral Q12H Loeffler, Grace C, PA-C   2 mg at 09/01/21 1006   metoprolol tartrate (LOPRESSOR) tablet 25 mg  25 mg Oral BID Mikey College T, MD   25 mg at 09/01/21 1007   mirtazapine  (REMERON) tablet 15 mg  15 mg Oral Daily Mikey College T, MD   15 mg at 09/01/21 1006   mometasone-formoterol (DULERA) 200-5 MCG/ACT inhaler 2 puff  2 puff Inhalation BID Mikey College T, MD   2 puff at 08/31/21 1933   montelukast (SINGULAIR) tablet 10 mg  10 mg Oral Daily Mikey College T, MD   10 mg at 09/01/21 1007   ondansetron (ZOFRAN) tablet 4 mg  4 mg Oral Q6H PRN Mikey College T, MD       Or   ondansetron [facility]) injection 4 mg  4 mg Intravenous Q6H PRN Mikey College T, MD       pantoprazole (PROTONIX) EC tablet 40 mg  40 mg Oral Daily Mikey College T, MD   40 mg at 09/01/21 1007   pravastatin (PRAVACHOL) tablet 40 mg  40 mg Oral Daily Mikey College T, MD   40 mg at 09/01/21 1006   predniSONE (DELTASONE) tablet 40 mg  40 mg Oral Q breakfast Mikey College T, MD   40 mg at 09/01/21 1007   rivaroxaban (XARELTO) tablet 20 mg  20 mg Oral Daily Mikey College T, MD   20 mg at 09/01/21 1008   roflumilast (DALIRESP) tablet 500 mcg  500 mcg Oral Daily Mikey College T, MD   500 mcg at 09/01/21 1006   tamsulosin (FLOMAX) capsule 0.4 mg  0.4 mg Oral Daily Mikey College T, MD   0.4 mg at 09/01/21 1007   thiamine tablet 100 mg  100 mg Oral Daily Loeffler, Grace C, PA-C   100 mg at 09/01/21 1007   Or   thiamine (B-1) injection 100 mg  100 mg Intravenous Daily Loeffler, Grace C, PA-C   100 mg at 08/29/21 1334     Discharge Medications: Please see discharge summary for a list of discharge medications.  Relevant Imaging Results:  Relevant Lab Results:   Additional Information SS# [id number] [place], [place]

## 2021-09-01 NOTE — Social Work (Signed)
CSW attempted to do assessment 2x. Pt was sleeping both times. CSW will continue to try.   Jimmy Picket, LCSW Clinical Social Worker

## 2021-09-01 NOTE — Progress Notes (Addendum)
PROGRESS NOTE  Marc Morris [id number] DOB: [birth year] DOA: 08/29/2021 PCP: Bonnita Nasuti, MD   LOS: 3 days   Brief Narrative / Interim history: Marc Morris is a 81 y.o. 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  He also reported several days of productive cough.  Has recent episodes of binge drinking, last drink was the night prior to admission.  Subjective / 24h Interval events: Getting a breathing treatment.  Complains of left rib cage pain  Assessment & Plan: Principal problem Acute on chronic hypoxic respiratory failure due to acute COPD exacerbation, underlying chronic hypoxic respiratory failure on 3 L at home-patient had significant wheezing, productive cough on admission and shortness of breath.  He was placed on doxycycline, steroids and breathing treatments.  Improving  Active problems Alcohol withdrawal, ETOH abuse-still appears to be withdrawing this morning, continue CIWA protocol, CIWA 12 this morning  Left-sided chest pain-due to his fall, x-ray without acute fractures.  Lidocaine patch today  Essential hypertension-continue metoprolol, lisinopril  Anxiety/depression-continue SSRI  Paroxysmal A. fib-continue metoprolol, anticoagulation with Xarelto  Frequent falls, deconditioning-OT evaluated today, recommends SNF.  Consult social worker  Normocytic anemia-mild, perhaps related to EtOH use, also dilutional component  Hyponatremia-mild, monitor  Scheduled Meds:  donepezil  5 mg Oral Daily   doxycycline  100 mg Oral Q12H   FLUoxetine  20 mg Oral Daily   folic acid  1 mg Oral Daily   guaiFENesin  1,200 mg Oral BID   ipratropium-albuterol  3 mL Nebulization TID   lidocaine  1 patch Transdermal Q24H   lisinopril  5 mg Oral Daily   LORazepam  0-4 mg Intravenous Q12H   Or    LORazepam  0-4 mg Oral Q12H   metoprolol tartrate  25 mg Oral BID   mirtazapine  15 mg Oral Daily   mometasone-formoterol  2 puff Inhalation BID   montelukast  10 mg Oral Daily   pantoprazole  40 mg Oral Daily   pravastatin  40 mg Oral Daily   predniSONE  40 mg Oral Q breakfast   rivaroxaban  20 mg Oral Daily   roflumilast  500 mcg Oral Daily   tamsulosin  0.4 mg Oral Daily   thiamine  100 mg Oral Daily   Or   thiamine  100 mg Intravenous Daily   Continuous Infusions: PRN Meds:.acetaminophen **OR** acetaminophen, albuterol, ondansetron **OR** ondansetron (ZOFRAN) IV  Diet Orders (From admission, onward)     Start     Ordered   08/29/21 1700  Diet Heart Room service appropriate? Yes; Fluid consistency: Thin  Diet effective now       Question Answer Comment  Room service appropriate? Yes   Fluid consistency: Thin      08/29/21 1700            DVT prophylaxis:  rivaroxaban (XARELTO) tablet 20 mg   Lab Results  Component Value Date   PLT 251 09/01/2021      Code Status: Full Code  Family Communication: No family at bedside, called sister Sheppard Coil, [phone], no answer, left VM  Status is: Inpatient  Remains inpatient appropriate because: CIWA, needs SNF  Level of care: Telemetry Medical  Consultants:  none  Procedures:  none  Microbiology  none  Antimicrobials: none    Objective: Vitals:   08/31/21 2103 09/01/21 0450 09/01/21  0839 09/01/21 0900  BP:  (!) 92/55  127/72  Pulse:  (!) 57  64  Resp:  16  19  Temp:  98.4 F (36.9 C)  97.7 F (36.5 C)  TempSrc:  Oral  Oral  SpO2: 96% 99% 98%   Weight:      Height:        Intake/Output Summary (Last 24 hours) at 09/01/2021 1103 Last data filed at 08/31/2021 1700 Gross per 24 hour  Intake 120 ml  Output --  Net 120 ml    Wt Readings from Last 3 Encounters:  08/31/21 56.4 kg  02/12/19 51.8 kg  09/29/11 57.2 kg    Examination:  Constitutional: No distress Eyes: Anicteric ENMT:  Moist extremities.  Neck: normal, supple Respiratory: Clear bilaterally, no wheezing Cardiovascular: Regular rate and rhythm, no murmurs, no edema Abdomen: Soft, NT, ND, bowel sounds positive Musculoskeletal: no clubbing / cyanosis.  Skin: No rashes seen Neurologic: No focal deficits   Data Reviewed: I have independently reviewed following labs and imaging studies   CBC Recent Labs  Lab 08/29/21 0949 08/30/21 0729 09/01/21 0046  WBC 6.1 7.7 9.3  HGB 13.2 11.2* 11.1*  HCT 41.0 33.2* 32.7*  PLT 231 267 251  MCV 86.3 87.6 85.2  MCH 27.8 29.6 28.9  MCHC 32.2 33.7 33.9  RDW 15.8* 16.9* 15.9*     Recent Labs  Lab 08/29/21 0949 08/29/21 1218 09/01/21 0046  NA 131*  --  135  K 5.1  --  3.9  CL 92*  --  97*  CO2 23  --  25  GLUCOSE 108*  --  102*  BUN 9  --  24*  CREATININE 0.83  --  1.15  CALCIUM 9.6  --  9.1  AST  --  39 22  ALT  --  28 18  ALKPHOS  --  84 71  BILITOT  --  0.9 0.4  ALBUMIN  --  4.0 3.3*  INR  --  0.8  --      ------------------------------------------------------------------------------------------------------------------ No results for input(s): CHOL, HDL, LDLCALC, TRIG, CHOLHDL, LDLDIRECT in the last 72 hours.  No results found for: HGBA1C ------------------------------------------------------------------------------------------------------------------ No results for input(s): TSH, T4TOTAL, T3FREE, THYROIDAB in the last 72 hours.  Invalid input(s): FREET3  Cardiac Enzymes No results for input(s): CKMB, TROPONINI, MYOGLOBIN in the last 168 hours.  Invalid input(s): CK ------------------------------------------------------------------------------------------------------------------ No results found for: BNP  CBG: No results for input(s): GLUCAP in the last 168 hours.  No results found for this or any previous visit (from the past 240 hour(s)).   Radiology Studies: No results found.   Marzetta Board, MD, PhD [facility]  Between 7 am - 7 pm I am available, please contact me via Amion (for emergencies) or Securechat (non urgent messages)  Between 7 pm - 7 am I am not available, please contact night coverage MD/APP via Amion

## 2021-09-02 MED ORDER — LORAZEPAM 2 MG/ML IJ SOLN: 1.0000 mg | INTRAMUSCULAR | Status: AC | PRN

## 2021-09-02 MED ORDER — ADULT MULTIVITAMIN W/MINERALS CH
1.0000 | ORAL_TABLET | Freq: Every day | ORAL | Status: DC
  Administered 2021-09-02 – 2021-09-05 (×4): 1 via ORAL
  Filled 2021-09-02 (×4): qty 1

## 2021-09-02 MED ORDER — CHLORDIAZEPOXIDE HCL 5 MG PO CAPS
5.0000 mg | ORAL_CAPSULE | Freq: Three times a day (TID) | ORAL | Status: DC
  Administered 2021-09-02 – 2021-09-06 (×11): 5 mg via ORAL
  Filled 2021-09-02 (×11): qty 1

## 2021-09-02 MED ORDER — LORAZEPAM 1 MG PO TABS
1.0000 mg | ORAL_TABLET | ORAL | Status: AC | PRN
  Administered 2021-09-02 – 2021-09-04 (×2): 1 mg via ORAL
  Filled 2021-09-02 (×2): qty 1

## 2021-09-02 NOTE — TOC Initial Note (Addendum)
Transition of Care [facility]) - Initial/Assessment Note    Patient Details  Name: Marc Morris MRN: [id number] Date of Birth: 1940/11/15  [facility] of [facility]) CM/SW Contact:    Emeterio Reeve, LCSW Phone Number: 09/02/2021, 2:02 PM  Clinical Narrative:                  CSW received SNF consult. CSW met with pt at bedside. CSW introduced self and explained role at the hospital. Pt reports that PTA he lived at home with "someone". Pt did not say who that someone is. Pt reports he was independent with mobility and ADL's.   CSW reviewed PT/OT recommendations for SNF. Pt reports he is fine with going to a SNF. Pt gave CSW permission to fax out to facilities in the area. Pt has no preference of facility at this time. CSW gave pt medicare.gov rating list to review. CSW explained insurance auth process. Pt reports they are NOT covid vaccinated.  CSW and pt discussed his alcohol use. Pt reports that he knows he needs to stop. Pt stated he already has alcohol resources.   CSW will continue to follow.   Expected Discharge Plan: Skilled Nursing Facility Barriers to Discharge: Continued Medical Work up, Ship broker   Patient Goals and CMS Choice Patient states their goals for this hospitalization and ongoing recovery are:: to get stronger CMS Medicare.gov Compare Post Acute Care list provided to:: Patient Choice offered to / list presented to : Patient  Expected Discharge Plan and Services Expected Discharge Plan: [place] arrangements for the past 2 months: Single Family Home                                      Prior Living Arrangements/Services Living arrangements for the past 2 months: Single Family Home Lives with:: Roommate Patient language and need for interpreter reviewed:: Yes Do you feel safe going back to the place where you live?: Yes      Need for Family Participation in Patient Care: Yes (Comment) Care giver support  system in place?: Yes (comment)   Criminal Activity/Legal Involvement Pertinent to Current Situation/Hospitalization: No - Comment as needed  Activities of Daily Living      Permission Sought/Granted Permission sought to share information with : Facility Art therapist granted to share information with : Yes, Verbal Permission Granted     Permission granted to share info w AGENCY: SNF        Emotional Assessment Appearance:: Appears stated age Attitude/Demeanor/Rapport: Engaged Affect (typically observed): Appropriate Orientation: : Oriented to Self, Oriented to Place, Oriented to  Time Alcohol / Substance Use: Not Applicable Psych Involvement: No (comment)  Admission diagnosis:  COPD (chronic obstructive pulmonary disease) (HCC) [J44.9] COPD exacerbation (HCC) [J44.1] Failure to thrive in adult [R62.7] Decreased oral intake [R63.8] Nausea and vomiting, unspecified vomiting type [R11.2] Patient Active Problem List   Diagnosis Date Noted   Chronic respiratory failure with hypoxia -- 2L/min 02/13/2019   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  Emeterio Reeve, LCSW Clinical Social Worker

## 2021-09-02 NOTE — Progress Notes (Signed)
PROGRESS NOTE  [name] [id number] DOB: [birth year] DOA: 08/29/2021 PCP: Bonnita Nasuti, MD   LOS: 4 days   Brief Narrative / Interim history: Marc Morris is a 81 y.o. 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  He also reported several days of productive cough.  Has recent episodes of binge drinking, last drink was the night prior to admission.  Subjective / 24h Interval events: Continues to complain of pain in his left rib  Assessment & Plan: Principal problem Acute on chronic hypoxic respiratory failure due to acute COPD exacerbation, underlying chronic hypoxic respiratory failure on 3 L at home-patient had significant wheezing, productive cough on admission and shortness of breath.  He was placed on doxycycline, steroids and breathing treatments.  Improving, finished course  Active problems Alcohol withdrawal, ETOH abuse-continues to be withdrawing, CIWA 27 this morning.  Previously given  Left-sided chest pain-due to his fall, x-ray without acute fractures.  Continue lidocaine patch, PT, mobilization  Essential hypertension-continue metoprolol, lisinopril, blood pressure stable  Anxiety/depression-continue SSRI  Paroxysmal A. fib-continue metoprolol, anticoagulation with Xarelto  Frequent falls, deconditioning-SNF recommended, social worker consulted.  Once withdrawals better should be able to go perhaps in 1 to 2 days  Normocytic anemia-mild, perhaps related to EtOH use, also dilutional component  Hyponatremia-mild, monitor  Scheduled Meds:  donepezil  5 mg Oral Daily   doxycycline  100 mg Oral Q12H   FLUoxetine  20 mg Oral Daily   folic acid  1 mg Oral Daily   guaiFENesin  1,200 mg Oral BID   ipratropium-albuterol  3 mL Nebulization TID   lidocaine  1 patch Transdermal Q24H    lisinopril  5 mg Oral Daily   metoprolol tartrate  25 mg Oral BID   mirtazapine  15 mg Oral Daily   mometasone-formoterol  2 puff Inhalation BID   montelukast  10 mg Oral Daily   multivitamin with minerals  1 tablet Oral Daily   pantoprazole  40 mg Oral Daily   pravastatin  40 mg Oral Daily   predniSONE  40 mg Oral Q breakfast   rivaroxaban  20 mg Oral Daily   roflumilast  500 mcg Oral Daily   tamsulosin  0.4 mg Oral Daily   thiamine  100 mg Oral Daily   Or   thiamine  100 mg Intravenous Daily   Continuous Infusions: PRN Meds:.acetaminophen **OR** acetaminophen, albuterol, LORazepam **OR** LORazepam, ondansetron **OR** ondansetron (ZOFRAN) IV  Diet Orders (From admission, onward)     Start     Ordered   08/29/21 1700  Diet Heart Room service appropriate? Yes; Fluid consistency: Thin  Diet effective now       Question Answer Comment  Room service appropriate? Yes   Fluid consistency: Thin      08/29/21 1700            DVT prophylaxis:  rivaroxaban (XARELTO) tablet 20 mg   Lab Results  Component Value Date   PLT 251 09/01/2021      Code Status: Full Code  Family Communication: No family at bedside, called sister Sheppard Coil, [phone], no answer, left VM  Status is: Inpatient  Remains inpatient appropriate because: CIWA, needs SNF  Level of care: Telemetry Medical  Consultants:  none  Procedures:  none  Microbiology  none  Antimicrobials: none    Objective: Vitals:  09/01/21 2134 09/02/21 0400 09/02/21 0829 09/02/21 0856  BP: (!) 114/59 112/64  139/79  Pulse: 84 83 68 81  Resp:  17 19 18   Temp:  98.4 F (36.9 C)  99 F (37.2 C)  TempSrc:  Oral  Oral  SpO2:  98% 99% 100%  Weight:      Height:        Intake/Output Summary (Last 24 hours) at 09/02/2021 1113 Last data filed at 09/01/2021 1500 Gross per 24 hour  Intake 240 ml  Output --  Net 240 ml    Wt Readings from Last 3 Encounters:  08/31/21 56.4 kg  02/12/19 51.8 kg   09/29/11 57.2 kg    Examination:  Constitutional: NAD Eyes: lids and conjunctivae normal, no scleral icterus ENMT: mmm Neck: normal, supple Respiratory: clear to auscultation bilaterally, no wheezing, no crackles. Normal respiratory effort.  Cardiovascular: Regular rate and rhythm, no murmurs / rubs / gallops. No LE edema. Abdomen: soft, no distention, no tenderness. Bowel sounds positive.  Skin: no rashes Neurologic: no focal deficits, equal strength   Data Reviewed: I have independently reviewed following labs and imaging studies   CBC Recent Labs  Lab 08/29/21 0949 08/30/21 0729 09/01/21 0046  WBC 6.1 7.7 9.3  HGB 13.2 11.2* 11.1*  HCT 41.0 33.2* 32.7*  PLT 231 267 251  MCV 86.3 87.6 85.2  MCH 27.8 29.6 28.9  MCHC 32.2 33.7 33.9  RDW 15.8* 16.9* 15.9*     Recent Labs  Lab 08/29/21 0949 08/29/21 1218 09/01/21 0046  NA 131*  --  135  K 5.1  --  3.9  CL 92*  --  97*  CO2 23  --  25  GLUCOSE 108*  --  102*  BUN 9  --  24*  CREATININE 0.83  --  1.15  CALCIUM 9.6  --  9.1  AST  --  39 22  ALT  --  28 18  ALKPHOS  --  84 71  BILITOT  --  0.9 0.4  ALBUMIN  --  4.0 3.3*  INR  --  0.8  --      ------------------------------------------------------------------------------------------------------------------ No results for input(s): CHOL, HDL, LDLCALC, TRIG, CHOLHDL, LDLDIRECT in the last 72 hours.  No results found for: HGBA1C ------------------------------------------------------------------------------------------------------------------ No results for input(s): TSH, T4TOTAL, T3FREE, THYROIDAB in the last 72 hours.  Invalid input(s): FREET3  Cardiac Enzymes No results for input(s): CKMB, TROPONINI, MYOGLOBIN in the last 168 hours.  Invalid input(s): CK ------------------------------------------------------------------------------------------------------------------ No results found for: BNP  CBG: No results for input(s): GLUCAP in the last 168  hours.  No results found for this or any previous visit (from the past 240 hour(s)).   Radiology Studies: No results found.   Marzetta Board, MD, PhD [facility]  Between 7 am - 7 pm I am available, please contact me via Amion (for emergencies) or Securechat (non urgent messages)  Between 7 pm - 7 am I am not available, please contact night coverage MD/APP via Amion

## 2021-09-02 NOTE — Progress Notes (Signed)
PT Cancellation Note  Patient Details Name: Marc Morris MRN: [id number] DOB: May 23, 1941   Cancelled Treatment:    Reason Eval/Treat Not Completed: Patient's level of consciousness Attempted to see pt earlier in AM however per nurse, pt had just been given ativan due to high CIWA score and was not appropriate for therapy at that time. Will follow.   Marguarite Arbour A Anup Brigham 09/02/2021, 1:16 PM Marisa Severin, PT, DPT Acute Rehabilitation Services Pager [phone] Office [phone]

## 2021-09-02 NOTE — Progress Notes (Signed)
Mobility Specialist Progress Note:   09/02/21 1100  Mobility  Activity Transferred from bed to chair  Level of Assistance Modified independent, requires aide device or extra time  Assistive Device None  Distance Ambulated (ft) 2 ft  Activity Response Tolerated well  $Mobility charge 1 Mobility   Pt with mild L rib pain throughout session. Agreed to transfer to chair this am. Pt required no physical assist throughout session. Pt left in chair with chair alarm on.   Addison Lank Mobility Specialist  Phone [phone]

## 2021-09-02 NOTE — Progress Notes (Signed)
Pt with CIWA 27. See flowsheet. VSS. Rapid Response RN and Attending MD notified. Ativan administered per order. Will continue to monitor.

## 2021-09-03 MED ORDER — TRAMADOL HCL 50 MG PO TABS
50.0000 mg | ORAL_TABLET | Freq: Two times a day (BID) | ORAL | Status: DC | PRN
  Administered 2021-09-03 – 2021-09-06 (×5): 50 mg via ORAL
  Filled 2021-09-03 (×5): qty 1

## 2021-09-03 MED ORDER — METHOCARBAMOL 500 MG PO TABS
500.0000 mg | ORAL_TABLET | Freq: Three times a day (TID) | ORAL | Status: DC | PRN
  Administered 2021-09-03 – 2021-09-05 (×4): 500 mg via ORAL
  Filled 2021-09-03 (×4): qty 1

## 2021-09-03 NOTE — Progress Notes (Signed)
PROGRESS NOTE  Marc LighterJames J Dante [id number] DOB: 1941-05-19 DOA: 08/29/2021 PCP: Galvin ProfferHague, Imran P, MD   LOS: 5 days   Brief Narrative / Interim history: Marc Morris is a 81 y.o. 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  He also reported several days of productive cough.  Has recent episodes of binge drinking, last drink was the night prior to admission.  Subjective / 24h Interval events: Left rib pain  Assessment & Plan: Principal problem Acute on chronic hypoxic respiratory failure due to acute COPD exacerbation, underlying chronic hypoxic respiratory failure on 3 L at home-patient had significant wheezing, productive cough on admission and shortness of breath.  He was placed on doxycycline, steroids and breathing treatments.  Improving, finished course.  Wheezing is resolved  Active problems Alcohol withdrawal, ETOH abuse-continues to have tremors but I do suspect there is a chronic component to them.  Alert and oriented to place, situation  Left-sided chest pain-due to his fall, x-ray without acute fractures.  Continue lidocaine patch, PT, add incentive spirometry, add muscle relaxers  Essential hypertension-continue metoprolol, lisinopril, blood pressure stable today  Anxiety/depression-continue SSRI  Paroxysmal A. fib-continue metoprolol, anticoagulation with Xarelto  Frequent falls, deconditioning-SNF recommended, social worker consulted.  Improving, SNF placement pending insurance authorization likely next week  Normocytic anemia-mild, perhaps related to EtOH use, also dilutional component  Hyponatremia-mild, monitor  Scheduled Meds:  chlordiazePOXIDE  5 mg Oral TID   donepezil  5 mg Oral Daily   FLUoxetine  20 mg Oral Daily   folic acid  1 mg Oral Daily   guaiFENesin  1,200 mg Oral BID    ipratropium-albuterol  3 mL Nebulization TID   lidocaine  1 patch Transdermal Q24H   lisinopril  5 mg Oral Daily   metoprolol tartrate  25 mg Oral BID   mirtazapine  15 mg Oral Daily   mometasone-formoterol  2 puff Inhalation BID   montelukast  10 mg Oral Daily   multivitamin with minerals  1 tablet Oral Daily   pantoprazole  40 mg Oral Daily   pravastatin  40 mg Oral Daily   rivaroxaban  20 mg Oral Daily   roflumilast  500 mcg Oral Daily   tamsulosin  0.4 mg Oral Daily   thiamine  100 mg Oral Daily   Or   thiamine  100 mg Intravenous Daily   Continuous Infusions: PRN Meds:.acetaminophen **OR** acetaminophen, albuterol, LORazepam **OR** LORazepam, methocarbamol, ondansetron **OR** ondansetron (ZOFRAN) IV, traMADol  Diet Orders (From admission, onward)     Start     Ordered   08/29/21 1700  Diet Heart Room service appropriate? Yes; Fluid consistency: Thin  Diet effective now       Question Answer Comment  Room service appropriate? Yes   Fluid consistency: Thin      08/29/21 1700            DVT prophylaxis:  rivaroxaban (XARELTO) tablet 20 mg   Lab Results  Component Value Date   PLT 251 09/01/2021      Code Status: Full Code  Family Communication: No family at bedside, called sister Devoria GlassingMary Williams, [phone], no answer, left VM  Status is: Inpatient  Remains inpatient appropriate because: CIWA, needs SNF  Level of care: Telemetry Medical  Consultants:  none  Procedures:  none  Microbiology  none  Antimicrobials: none  Objective: Vitals:   09/02/21 2218 09/03/21 0450 09/03/21 0809 09/03/21 0909  BP: 101/66 123/66 114/70   Pulse: 66 85 68 62  Resp:  18 16 17   Temp:  98.3 F (36.8 C) (!) 97.4 F (36.3 C)   TempSrc:  Oral Oral   SpO2:  100% 100% 100%  Weight:      Height:        Intake/Output Summary (Last 24 hours) at 09/03/2021 1142 Last data filed at 09/03/2021 0452 Gross per 24 hour  Intake 480 ml  Output 350 ml  Net 130 ml     Wt Readings from Last 3 Encounters:  08/31/21 56.4 kg  02/12/19 51.8 kg  09/29/11 57.2 kg    Examination:  Constitutional: NAD Eyes: lids and conjunctivae normal, no scleral icterus ENMT: mmm Neck: normal, supple Respiratory: clear to auscultation bilaterally, no wheezing, no crackles.  Cardiovascular: Regular rate and rhythm, no murmurs / rubs / gallops. No LE edema. Abdomen: soft, no distention. Bowel sounds positive.  Skin: no rashes Neurologic: Nonfocal, tremors present   Data Reviewed: I have independently reviewed following labs and imaging studies   CBC Recent Labs  Lab 08/29/21 0949 08/30/21 0729 09/01/21 0046  WBC 6.1 7.7 9.3  HGB 13.2 11.2* 11.1*  HCT 41.0 33.2* 32.7*  PLT 231 267 251  MCV 86.3 87.6 85.2  MCH 27.8 29.6 28.9  MCHC 32.2 33.7 33.9  RDW 15.8* 16.9* 15.9*     Recent Labs  Lab 08/29/21 0949 08/29/21 1218 09/01/21 0046  NA 131*  --  135  K 5.1  --  3.9  CL 92*  --  97*  CO2 23  --  25  GLUCOSE 108*  --  102*  BUN 9  --  24*  CREATININE 0.83  --  1.15  CALCIUM 9.6  --  9.1  AST  --  39 22  ALT  --  28 18  ALKPHOS  --  84 71  BILITOT  --  0.9 0.4  ALBUMIN  --  4.0 3.3*  INR  --  0.8  --      ------------------------------------------------------------------------------------------------------------------ No results for input(s): CHOL, HDL, LDLCALC, TRIG, CHOLHDL, LDLDIRECT in the last 72 hours.  No results found for: HGBA1C ------------------------------------------------------------------------------------------------------------------ No results for input(s): TSH, T4TOTAL, T3FREE, THYROIDAB in the last 72 hours.  Invalid input(s): FREET3  Cardiac Enzymes No results for input(s): CKMB, TROPONINI, MYOGLOBIN in the last 168 hours.  Invalid input(s): CK ------------------------------------------------------------------------------------------------------------------ No results found for: BNP  CBG: No results for  input(s): GLUCAP in the last 168 hours.  No results found for this or any previous visit (from the past 240 hour(s)).   Radiology Studies: No results found.   Marzetta Board, MD, PhD [facility]  Between 7 am - 7 pm I am available, please contact me via Amion (for emergencies) or Securechat (non urgent messages)  Between 7 pm - 7 am I am not available, please contact night coverage MD/APP via Amion

## 2021-09-03 NOTE — Progress Notes (Signed)
Mobility Specialist Progress Note:   09/03/21 1340  Mobility  Range of Motion/Exercises Active;Right leg;Left leg  Level of Assistance Independent  Assistive Device None  Activity Response Tolerated poorly  $Mobility charge 1 Mobility   Pt declined OOB mobility d/t significant L rib pain, despite max encouragement. Performed multiple bed level exercises, increasing rib pain. Pt left in bed with all needs met, and bed alarm on.   Nelta Numbers Mobility Specialist  Phone [phone]

## 2021-09-04 LAB — COMPREHENSIVE METABOLIC PANEL
ALT: UNDETERMINED U/L (ref 0–44)
AST: 20 U/L (ref 15–41)
Albumin: 3.3 g/dL — ABNORMAL LOW (ref 3.5–5.0)
Alkaline Phosphatase: 57 U/L (ref 38–126)
Anion gap: 11 (ref 5–15)
BUN: 26 mg/dL — ABNORMAL HIGH (ref 8–23)
CO2: 25 mmol/L (ref 22–32)
Calcium: 9.2 mg/dL (ref 8.9–10.3)
Chloride: 101 mmol/L (ref 98–111)
Creatinine, Ser: 0.79 mg/dL (ref 0.61–1.24)
GFR, Estimated: >60 mL/min (ref >60)
Glucose, Bld: 99 mg/dL (ref 70–99)
Potassium: 4.3 mmol/L (ref 3.5–5.1)
Sodium: 137 mmol/L (ref 135–145)
Total Bilirubin: UNDETERMINED mg/dL (ref 0.3–1.2)
Total Protein: 6.5 g/dL (ref 6.5–8.1)

## 2021-09-04 LAB — CBC
HCT: 34.9 % — ABNORMAL LOW (ref 39.0–52.0)
Hemoglobin: 11.3 g/dL — ABNORMAL LOW (ref 13.0–17.0)
MCH: 28.1 pg (ref 26.0–34.0)
MCHC: 32.4 g/dL (ref 30.0–36.0)
MCV: 86.8 fL (ref 80.0–100.0)
Platelets: 294 10*3/uL (ref 150–400)
RBC: 4.02 MIL/uL — ABNORMAL LOW (ref 4.22–5.81)
RDW: 15.7 % — ABNORMAL HIGH (ref 11.5–15.5)
WBC: 8.3 10*3/uL (ref 4.0–10.5)
nRBC: 0 % (ref 0.0–0.2)

## 2021-09-04 LAB — MAGNESIUM: Magnesium: UNDETERMINED mg/dL (ref 1.7–2.4)

## 2021-09-04 NOTE — Progress Notes (Signed)
PROGRESS NOTE  Marc Morris [id number] DOB: 21-Apr-1941 DOA: 08/29/2021 PCP: Bonnita Nasuti, MD   LOS: 6 days   Brief Narrative / Interim history: Marc Morris is a 81 y.o. 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  He also reported several days of productive cough.  Has recent episodes of binge drinking, last drink was the night prior to admission.  Subjective / 24h Interval events: Feels better.  Continues to complain of left rib pain  Assessment & Plan: Principal problem Acute on chronic hypoxic respiratory failure due to acute COPD exacerbation, underlying chronic hypoxic respiratory failure on 3 L at home-patient had significant wheezing, productive cough on admission and shortness of breath.  He was placed on doxycycline, steroids and breathing treatments.  Improving, finished course.  Wheezing is now resolved, respiratory status appears back to baseline  Active problems Alcohol withdrawal, ETOH abuse-continues to have tremors but I do suspect there is a chronic component to them.  No further significant withdrawals  Left-sided chest pain-due to his fall, x-ray without acute fractures.  Continue lidocaine patch, PT, add incentive spirometry, add muscle relaxers  Essential hypertension-continue metoprolol, lisinopril, blood pressure stable today  Anxiety/depression-continue SSRI  Paroxysmal A. fib-continue metoprolol, anticoagulation with Xarelto  Frequent falls, deconditioning-SNF recommended, social worker consulted.  Improving, SNF placement pending insurance authorization likely next week  Normocytic anemia-mild, perhaps related to EtOH use, also dilutional component  Hyponatremia-resolved, sodium normalized this morning  Scheduled Meds:  chlordiazePOXIDE  5 mg Oral TID   donepezil  5 mg  Oral Daily   FLUoxetine  20 mg Oral Daily   folic acid  1 mg Oral Daily   guaiFENesin  1,200 mg Oral BID   ipratropium-albuterol  3 mL Nebulization TID   lidocaine  1 patch Transdermal Q24H   lisinopril  5 mg Oral Daily   metoprolol tartrate  25 mg Oral BID   mirtazapine  15 mg Oral Daily   mometasone-formoterol  2 puff Inhalation BID   montelukast  10 mg Oral Daily   multivitamin with minerals  1 tablet Oral Daily   pantoprazole  40 mg Oral Daily   pravastatin  40 mg Oral Daily   rivaroxaban  20 mg Oral Daily   roflumilast  500 mcg Oral Daily   tamsulosin  0.4 mg Oral Daily   thiamine  100 mg Oral Daily   Or   thiamine  100 mg Intravenous Daily   Continuous Infusions: PRN Meds:.acetaminophen **OR** acetaminophen, albuterol, LORazepam **OR** LORazepam, methocarbamol, ondansetron **OR** ondansetron (ZOFRAN) IV, traMADol  Diet Orders (From admission, onward)     Start     Ordered   08/29/21 1700  Diet Heart Room service appropriate? Yes; Fluid consistency: Thin  Diet effective now       Question Answer Comment  Room service appropriate? Yes   Fluid consistency: Thin      08/29/21 1700            DVT prophylaxis:  rivaroxaban (XARELTO) tablet 20 mg   Lab Results  Component Value Date   PLT 294 09/04/2021      Code Status: Full Code  Family Communication: No family at bedside, called sister Sheppard Coil, [phone], no answer, left VM  Status is: Inpatient  Remains inpatient appropriate because: CIWA, needs SNF  Level of care: Telemetry Medical  Consultants:  none  Procedures:  none  Microbiology  none  Antimicrobials: none    Objective: Vitals:   09/03/21 2050 09/04/21 0518 09/04/21 0808 09/04/21 0828  BP:  128/67 128/73   Pulse:  71 (!) 57   Resp:  16 19   Temp:  98.2 F (36.8 C) 98 F (36.7 C)   TempSrc:  Oral Oral   SpO2: 98% 100% 100% 99%  Weight:      Height:        Intake/Output Summary (Last 24 hours) at 09/04/2021 0959 Last  data filed at 09/04/2021 0542 Gross per 24 hour  Intake --  Output 550 ml  Net -550 ml    Wt Readings from Last 3 Encounters:  08/31/21 56.4 kg  02/12/19 51.8 kg  09/29/11 57.2 kg    Examination:  Constitutional: NAD Eyes: lids and conjunctivae normal, no scleral icterus ENMT: mmm Neck: normal, supple Respiratory: clear to auscultation bilaterally, no wheezing, no crackles.  Cardiovascular: Regular rate and rhythm, no murmurs / rubs / gallops. No LE edema. Abdomen: soft, no distention, no tenderness. Bowel sounds positive.  Skin: no rashes Neurologic: no focal deficits, equal strength, tremors present   Data Reviewed: I have independently reviewed following labs and imaging studies   CBC Recent Labs  Lab 08/29/21 0949 08/30/21 0729 09/01/21 0046 09/04/21 0043  WBC 6.1 7.7 9.3 8.3  HGB 13.2 11.2* 11.1* 11.3*  HCT 41.0 33.2* 32.7* 34.9*  PLT 231 267 251 294  MCV 86.3 87.6 85.2 86.8  MCH 27.8 29.6 28.9 28.1  MCHC 32.2 33.7 33.9 32.4  RDW 15.8* 16.9* 15.9* 15.7*     Recent Labs  Lab 08/29/21 0949 08/29/21 1218 09/01/21 0046 09/04/21 0043  NA 131*  --  135 137  K 5.1  --  3.9 4.3  CL 92*  --  97* 101  CO2 23  --  25 25  GLUCOSE 108*  --  102* 99  BUN 9  --  24* 26*  CREATININE 0.83  --  1.15 0.79  CALCIUM 9.6  --  9.1 9.2  AST  --  39 22 20  ALT  --  28 18 QUANTITY NOT SUFFICIENT, UNABLE TO PERFORM TEST  ALKPHOS  --  84 71 57  BILITOT  --  0.9 0.4 QUANTITY NOT SUFFICIENT, UNABLE TO PERFORM TEST  ALBUMIN  --  4.0 3.3* 3.3*  MG  --   --   --  QUANTITY NOT SUFFICIENT, UNABLE TO PERFORM TEST  INR  --  0.8  --   --      ------------------------------------------------------------------------------------------------------------------ No results for input(s): CHOL, HDL, LDLCALC, TRIG, CHOLHDL, LDLDIRECT in the last 72 hours.  No results found for:  HGBA1C ------------------------------------------------------------------------------------------------------------------ No results for input(s): TSH, T4TOTAL, T3FREE, THYROIDAB in the last 72 hours.  Invalid input(s): FREET3  Cardiac Enzymes No results for input(s): CKMB, TROPONINI, MYOGLOBIN in the last 168 hours.  Invalid input(s): CK ------------------------------------------------------------------------------------------------------------------ No results found for: BNP  CBG: No results for input(s): GLUCAP in the last 168 hours.  No results found for this or any previous visit (from the past 240 hour(s)).   Radiology Studies: No results found.   Marzetta Board, MD, PhD [facility]  Between 7 am - 7 pm I am available, please contact me via Amion (for emergencies) or Securechat (non urgent messages)  Between 7 pm - 7 am I am not available, please contact night coverage MD/APP via Amion

## 2021-09-04 NOTE — Progress Notes (Signed)
Mobility Specialist Progress Note:   09/04/21 0950  Mobility  Range of Motion/Exercises Active;Right leg;Left leg  Level of Assistance Independent  Assistive Device None  Activity Response Tolerated poorly  $Mobility charge 1 Mobility   Pt with significant rib pain this am. Declined OOB mobility, performed bed-level exercises instead. Will f/u later today to transfer to chair if pain permits.   Addison Lank Mobility Specialist  Phone [phone]

## 2021-09-04 NOTE — TOC Progression Note (Signed)
Transition of Care [facility]) - Progression Note    Patient Details  Name: Marc Morris MRN: [id number] Date of Birth: Jun 01, 1941  [facility] CM/SW Contact  Levada Schilling Phone Number: 09/04/2021, 12:43 PM  Clinical Narrative:     CSW spoke with pt at bedside concerning SNF choice.  Pt  asked CSW to speak with sister Scarlette Ar @ [phone].  CSW left vm for pt's sister to contact CSW.  TOC will continue to assist with disposition planning.  Expected Discharge Plan: Skilled Nursing Facility Barriers to Discharge: Continued Medical Work up, English as a second language teacher  Expected Discharge Plan and Services Expected Discharge Plan: Skilled Nursing Facility       Living arrangements for the past 2 months: Single Family Home                                       Social Determinants of Health (SDOH) Interventions    Readmission Risk Interventions No flowsheet data found.

## 2021-09-04 NOTE — Plan of Care (Signed)
°  Problem: Respiratory: Goal: Levels of oxygenation will improve Outcome: Progressing   Problem: Clinical Measurements: Goal: Respiratory complications will improve Outcome: Progressing   Problem: Nutrition: Goal: Adequate nutrition will be maintained Outcome: Progressing   Problem: Pain Managment: Goal: General experience of comfort will improve Outcome: Progressing

## 2021-09-05 LAB — RESP PANEL BY RT-PCR (FLU A&B, COVID) ARPGX2
Influenza A by PCR: NEGATIVE
Influenza B by PCR: NEGATIVE
SARS Coronavirus 2 by RT PCR: NEGATIVE

## 2021-09-05 MED ORDER — SODIUM CHLORIDE 0.9 % IV SOLN: INTRAVENOUS | Status: AC

## 2021-09-05 NOTE — Discharge Instructions (Addendum)

## 2021-09-05 NOTE — Progress Notes (Signed)
Physical Therapy Treatment Patient Details Name: Marc Morris MRN: [id number] DOB: 1940/10/25 Today's Date: 09/05/2021   History of Present Illness Pt is a 81 y/o male admitted secondary to falls and COPD exacerbation. Pt with L sided rib pain, however, imaging negative. PMH includes COPD, alcohol abuse, HTN and a fib.    PT Comments    Continuing work on functional mobility and activity tolerance;  Offered walking with RW, however pt declined; agreed to getting up and OOB with encouragement, and offered rationale for getting up because of the effects of bedrest; Pt got to EOB without physical assist; very unsteady with standing and stepping ot recliner with rW; mod assist to steady  Recommendations for follow up therapy are one component of a multi-disciplinary discharge planning process, led by the attending physician.  Recommendations may be updated based on patient status, additional functional criteria and insurance authorization.  Follow Up Recommendations  Skilled nursing-short term rehab (<3 hours/day)     Assistance Recommended at Discharge Frequent or constant Supervision/Assistance  Patient can return home with the following A little help with walking and/or transfers;A little help with bathing/dressing/bathroom   Equipment Recommendations  Wheelchair cushion (measurements PT);Wheelchair (measurements PT)    Recommendations for Other Services       Precautions / Restrictions Precautions Precautions: Fall     Mobility  Bed Mobility Overal bed mobility: Needs Assistance Bed Mobility: Supine to Sit     Supine to sit: Min guard     General bed mobility comments: no physical assist needed for pt to pull to semi-long sit, then turn to EOB    Transfers Overall transfer level: Needs assistance Equipment used: Rolling walker (2 wheels) Transfers: Sit to/from Stand, Bed to chair/wheelchair/BSC Sit to Stand: Mod assist   Step pivot transfers: Mod assist        General transfer comment: Mod assist to steady at initial stand; Mod assist to keep balance with pivotal steps bed to recliner; very unsteady    Ambulation/Gait                   Stairs             Wheelchair Mobility    Modified Rankin (Stroke Patients Only)       Balance     Sitting balance-Leahy Scale: Fair                                      Cognition Arousal/Alertness: Awake/alert Behavior During Therapy: Flat affect Overall Cognitive Status: No family/caregiver present to determine baseline cognitive functioning                                 General Comments: pt with inconsistent ability to respond to questions, slow response speed, hearing likely a factor        Exercises      General Comments General comments (skin integrity, edema, etc.): Session conducted on 3 L supplemental O2      Pertinent Vitals/Pain Pain Assessment Pain Assessment: Faces Faces Pain Scale: Hurts little more Pain Location: L ribs, esp with cough Pain Descriptors / Indicators: Grimacing, Guarding, Sharp Pain Intervention(s): Monitored during session, Other (comment) (Demonstrated pillow splinting)    Home Living  Prior Function            PT Goals (current goals can now be found in the care plan section) Acute Rehab PT Goals Patient Stated Goal: did not state PT Goal Formulation: With patient Time For Goal Achievement: 09/13/21 Potential to Achieve Goals: Good Progress towards PT goals: Progressing toward goals (slowly)    Frequency    Min 2X/week      PT Plan Current plan remains appropriate    Co-evaluation              AM-PAC PT "6 Clicks" Mobility   Outcome Measure  Help needed turning from your back to your side while in a flat bed without using bedrails?: A Little Help needed moving from lying on your back to sitting on the side of a flat bed without using bedrails?: A  Little Help needed moving to and from a bed to a chair (including a wheelchair)?: A Lot Help needed standing up from a chair using your arms (e.g., wheelchair or bedside chair)?: A Lot Help needed to walk in hospital room?: A Lot Help needed climbing 3-5 steps with a railing? : Total 6 Click Score: 13    End of Session   Activity Tolerance: Patient limited by pain Patient left: in chair;with call bell/phone within reach;with chair alarm set Nurse Communication: Mobility status PT Visit Diagnosis: Unsteadiness on feet (R26.81);Muscle weakness (generalized) (M62.81);Difficulty in walking, not elsewhere classified (R26.2);History of falling (Z91.81)     Time: [id number] PT Time Calculation (min) (ACUTE ONLY): 12 min  Charges:  $Therapeutic Activity: 8-22 mins                     Roney Marion, PT  Acute Rehabilitation Services Pager [phone] Office [place] 09/05/2021, 2:32 PM

## 2021-09-05 NOTE — Progress Notes (Signed)
Mobility Specialist Progress Note:   09/05/21 1530  Mobility  Activity Transferred to/from [facility]  Level of Assistance Modified independent, requires aide device or extra time  Assistive Device Front wheel walker  Distance Ambulated (ft) 4 ft  Activity Response Tolerated well  $Mobility charge 1 Mobility   Pt on BSC requesting to get back to bed. Not requiring any physical assist throughout session. Pt back in bed with bed alarm on.   Addison Lank Mobility Specialist  Phone [phone]

## 2021-09-05 NOTE — TOC Progression Note (Signed)
Transition of Care [facility]) - Progression Note    Patient Details  Name: Marc Morris MRN: [id number] Date of Birth: 1941/03/30  Transition of Care [facility]) CM/SW Contact  Jimmy Picket, [place] Phone Number: 09/05/2021, 4:32 PM  Clinical Narrative:     CSW spoke to pts sister Ladora Daniel on the phone. CSW gave bed offers. [facility] chose [facility]    CSW confirmed that [facility] can accept pt at DC. CSW requested covid test from MD.  Expected Discharge Plan: Skilled Nursing Facility Barriers to Discharge: Continued Medical Work up, English as a second language teacher  Expected Discharge Plan and Services Expected Discharge Plan: Skilled Nursing Facility       Living arrangements for the past 2 months: Single Family Home                                       Social Determinants of Health (SDOH) Interventions    Readmission Risk Interventions No flowsheet data found.  Jimmy Picket, LCSW Clinical Social Worker

## 2021-09-05 NOTE — Progress Notes (Signed)
PROGRESS NOTE  ODINN BEVIER [id number] DOB: 1940-11-26 DOA: 08/29/2021 PCP: Bonnita Nasuti, MD   LOS: 7 days   Brief Narrative / Interim history: Marc Morris is a 81 y.o. 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  He also reported several days of productive cough.  Has recent episodes of binge drinking, last drink was the night prior to admission.  Subjective / 24h Interval events: Denies shortness of breath. Left sided chest pain persists  Assessment & Plan: Principal problem Acute on chronic hypoxic respiratory failure due to acute COPD exacerbation, underlying chronic hypoxic respiratory failure on 3 L at home-patient had significant wheezing, productive cough on admission and shortness of breath.  He was placed on doxycycline, steroids and breathing treatments.  Improving, finished course.  Wheezing is now resolved, respiratory status appears back to baseline  Active problems Alcohol withdrawal, ETOH abuse-continues to have tremors but I do suspect there is a chronic component to them.  No further significant withdrawals  Left-sided chest pain-due to his fall, x-ray without acute fractures.  Continue lidocaine patch, PT, add incentive spirometry, add muscle relaxers  Essential hypertension-continue metoprolol, lisinopril, blood pressure stable today  Anxiety/depression-continue SSRI  Paroxysmal A. fib-continue metoprolol, anticoagulation with Xarelto  Frequent falls, deconditioning-SNF recommended, social worker consulted.  Improving, SNF placement pending   Normocytic anemia-mild, perhaps related to EtOH use, also dilutional component  Hyponatremia-resolved, sodium normalized this morning  Scheduled Meds:  chlordiazePOXIDE  5 mg Oral TID   donepezil  5 mg Oral Daily   FLUoxetine  20 mg Oral  Daily   folic acid  1 mg Oral Daily   guaiFENesin  1,200 mg Oral BID   ipratropium-albuterol  3 mL Nebulization TID   lidocaine  1 patch Transdermal Q24H   lisinopril  5 mg Oral Daily   metoprolol tartrate  25 mg Oral BID   mirtazapine  15 mg Oral Daily   mometasone-formoterol  2 puff Inhalation BID   montelukast  10 mg Oral Daily   multivitamin with minerals  1 tablet Oral Daily   pantoprazole  40 mg Oral Daily   pravastatin  40 mg Oral Daily   rivaroxaban  20 mg Oral Daily   roflumilast  500 mcg Oral Daily   tamsulosin  0.4 mg Oral Daily   thiamine  100 mg Oral Daily   Or   thiamine  100 mg Intravenous Daily   Continuous Infusions: PRN Meds:.acetaminophen **OR** acetaminophen, albuterol, methocarbamol, ondansetron **OR** ondansetron (ZOFRAN) IV, traMADol  Diet Orders (From admission, onward)     Start     Ordered   08/29/21 1700  Diet Heart Room service appropriate? Yes; Fluid consistency: Thin  Diet effective now       Question Answer Comment  Room service appropriate? Yes   Fluid consistency: Thin      08/29/21 1700            DVT prophylaxis:  rivaroxaban (XARELTO) tablet 20 mg   Lab Results  Component Value Date   PLT 294 09/04/2021      Code Status: Full Code  Family Communication: No family at bedside, called sister Sheppard Coil, [phone], no answer, left VM  Status is: Inpatient  Remains inpatient appropriate because:  needs SNF  Level of care: Telemetry Medical  Consultants:  none  Procedures:  none  Microbiology  none  Antimicrobials: none    Objective: Vitals:   09/04/21 2043 09/05/21 0558 09/05/21 0805 09/05/21 0938  BP:  103/67 118/67   Pulse:  62 (!) 56   Resp:  17 15   Temp:  98.2 F (36.8 C) 98 F (36.7 C)   TempSrc:  Oral    SpO2: 100% 100% 94% 96%  Weight:      Height:        Intake/Output Summary (Last 24 hours) at 09/05/2021 1430 Last data filed at 09/05/2021 0900 Gross per 24 hour  Intake 210 ml  Output  420 ml  Net -210 ml    Wt Readings from Last 3 Encounters:  08/31/21 56.4 kg  02/12/19 51.8 kg  09/29/11 57.2 kg    Examination:  Constitutional: NAD Respiratory: CTA  Cardiovascular: RRR  Data Reviewed: I have independently reviewed following labs and imaging studies   CBC Recent Labs  Lab 08/30/21 0729 09/01/21 0046 09/04/21 0043  WBC 7.7 9.3 8.3  HGB 11.2* 11.1* 11.3*  HCT 33.2* 32.7* 34.9*  PLT 267 251 294  MCV 87.6 85.2 86.8  MCH 29.6 28.9 28.1  MCHC 33.7 33.9 32.4  RDW 16.9* 15.9* 15.7*     Recent Labs  Lab 09/01/21 0046 09/04/21 0043  NA 135 137  K 3.9 4.3  CL 97* 101  CO2 25 25  GLUCOSE 102* 99  BUN 24* 26*  CREATININE 1.15 0.79  CALCIUM 9.1 9.2  AST 22 20  ALT 18 QUANTITY NOT SUFFICIENT, UNABLE TO PERFORM TEST  ALKPHOS 71 57  BILITOT 0.4 QUANTITY NOT SUFFICIENT, UNABLE TO PERFORM TEST  ALBUMIN 3.3* 3.3*  MG  --  QUANTITY NOT SUFFICIENT, UNABLE TO PERFORM TEST     ------------------------------------------------------------------------------------------------------------------ No results for input(s): CHOL, HDL, LDLCALC, TRIG, CHOLHDL, LDLDIRECT in the last 72 hours.  No results found for: HGBA1C ------------------------------------------------------------------------------------------------------------------ No results for input(s): TSH, T4TOTAL, T3FREE, THYROIDAB in the last 72 hours.  Invalid input(s): FREET3  Cardiac Enzymes No results for input(s): CKMB, TROPONINI, MYOGLOBIN in the last 168 hours.  Invalid input(s): CK ------------------------------------------------------------------------------------------------------------------ No results found for: BNP  CBG: No results for input(s): GLUCAP in the last 168 hours.  No results found for this or any previous visit (from the past 240 hour(s)).   Radiology Studies: No results found.   Marzetta Board, MD, PhD [facility]  Between 7 am - 7 pm I am available, please  contact me via Amion (for emergencies) or Securechat (non urgent messages)  Between 7 pm - 7 am I am not available, please contact night coverage MD/APP via Amion

## 2021-09-05 NOTE — Progress Notes (Signed)
Mobility Specialist Progress Note:   09/05/21 1030  Mobility  Range of Motion/Exercises Active;Right leg;Left leg  Level of Assistance Independent  Assistive Device None  Activity Response Tolerated fair  $Mobility charge 1 Mobility   Despite max encouragement, pt declined OOB mobility. States he will get up later when pain decreases, will f/u this afternoon. Performed bed level exercises this am. Educated pt on needing to gain baseline strength back, pt voiced understanding.   Addison Lank Mobility Specialist  Phone [phone]

## 2021-09-06 DIAGNOSIS — R627 Adult failure to thrive: Secondary | ICD-10-CM

## 2021-09-06 DIAGNOSIS — J441 Chronic obstructive pulmonary disease with (acute) exacerbation: Secondary | ICD-10-CM

## 2021-09-06 DIAGNOSIS — R251 Tremor, unspecified: Secondary | ICD-10-CM

## 2021-09-06 DIAGNOSIS — R638 Other symptoms and signs concerning food and fluid intake: Secondary | ICD-10-CM

## 2021-09-06 MED ORDER — ALPRAZOLAM 1 MG PO TABS: 1.0000 mg | ORAL_TABLET | Freq: Two times a day (BID) | ORAL | 0 refills | Status: DC | PRN

## 2021-09-06 MED ORDER — PNEUMOCOCCAL VAC POLYVALENT 25 MCG/0.5ML IJ INJ
0.5000 mL | INJECTION | INTRAMUSCULAR | Status: DC | PRN
  Filled 2021-09-06: qty 0.5

## 2021-09-06 MED ORDER — LIDOCAINE 5 % EX PTCH: 1.0000 | MEDICATED_PATCH | CUTANEOUS | 0 refills | Status: DC

## 2021-09-06 MED ORDER — CHLORDIAZEPOXIDE HCL 5 MG PO CAPS: 5.0000 mg | ORAL_CAPSULE | Freq: Three times a day (TID) | ORAL | 0 refills | Status: DC

## 2021-09-06 NOTE — Progress Notes (Signed)
Mobility Specialist Progress Note:   09/06/21 1015  Mobility  Range of Motion/Exercises Active;Right leg;Left leg  Level of Assistance Independent  Assistive Device None  Activity Response Tolerated fair  $Mobility charge 1 Mobility   Pt declined OOB mobility at this time. Performed multiple bed-level exercises. Pt in mild pain throughout session. Left pt in bed with bed alarm on.   Addison Lank Mobility Specialist  Phone [phone]

## 2021-09-06 NOTE — Assessment & Plan Note (Signed)
-   Patient no longer withdrawing, but is noted to have an intentional tremor, which is likely due to long-term EtOH use

## 2021-09-06 NOTE — Care Management Important Message (Signed)
Important Message  Patient Details  Name: Marc Morris MRN: [id number] Date of Birth: Jan 20, 1941   Medicare Important Message Given:  Yes     Hannah Beat 09/06/2021, 4:08 PM

## 2021-09-06 NOTE — TOC Transition Note (Signed)
Transition of Care [facility]) - CM/SW Discharge Note   Patient Details  Name: Marc Morris MRN: [id number] Date of Birth: [birth year]  [facility] Care [facility]) CM/SW Contact:  Emeterio Reeve, LCSW Phone Number: 09/06/2021, 12:51 PM   Clinical Narrative:     Per MD patient ready for DC to [facility]. RN, patient, patient's family, and facility notified of DC. Discharge Summary and FL2 sent to facility. DC packet on chart. Insurance Josem Kaufmann has been received (Auth ID [id number]) and pt is covid negative. Ambulance transport requested for patient.    RN to call report to [phone].  CSW will sign off for now as social work intervention is no longer needed. Please consult [place] again if new needs arise.   Final next level of care: Skilled Nursing Facility Barriers to Discharge: Barriers Resolved   Patient Goals and CMS Choice Patient states their goals for this hospitalization and ongoing recovery are:: to get stronger CMS Medicare.gov Compare Post Acute Care list provided to:: Patient Choice offered to / list presented to : Patient  Discharge Placement              Patient chooses bed at:  ([place]) Patient to be transferred to facility by: [place] Name of family member notified: Jimmye Norman Floreen Comber Sister [phone] Patient and family notified of of transfer: 09/06/21  Discharge Plan and Services                                     Social Determinants of Health (SDOH) Interventions     Readmission Risk Interventions No flowsheet data found.

## 2021-09-06 NOTE — Progress Notes (Signed)
Report called to Engineer, water at [facility]. PTAR to pick up this afternoon.

## 2021-09-06 NOTE — TOC Transition Note (Incomplete)
Transition of Care [facility]) - CM/SW Discharge Note   Patient Details  Name: Marc Morris MRN: [id number] Date of Birth: 1940/12/18  [facility]) CM/SW Contact:  Erin Sons, LCSW Phone Number: 09/06/2021, 10:26 AM   Clinical Narrative:     Confirmed with [facility] that pt can admit today. [employer] is Berkley Harvey is currently under waiver and pt can admit with auth pending. CSW started SNF auth with [employer]; ref#: [id number]   Final next level of care: Skilled Nursing Facility Barriers to Discharge: No Barriers Identified   Patient Goals and CMS Choice Patient states their goals for this hospitalization and ongoing recovery are:: to get stronger CMS Medicare.gov Compare Post Acute Care list provided to:: Patient Choice offered to / list presented to : Patient  Discharge Placement              Patient chooses bed at:  [facility] [place]) Patient to be transferred to facility by: PTAR Name of family member notified: [name] Sister [phone] Patient and family notified of of transfer: 09/06/21  Discharge Plan and Services                                     Social Determinants of Health (SDOH) Interventions     Readmission Risk Interventions No flowsheet data found.

## 2021-09-06 NOTE — Progress Notes (Signed)
Pt transported off unit via gurney by PTAR with all belongings by side. Pt remains awake and alert and stable at baseline at 2L [place].

## 2021-09-06 NOTE — Discharge Summary (Signed)
Physician Discharge Summary  [name] [id number] DOB: [birth year] DOA: 08/29/2021  PCP: Bonnita Nasuti, MD  Admit date: 08/29/2021 Discharge date: 09/06/2021  Admitted From: home Disposition:  SNF  Recommendations for Outpatient Follow-up:  Follow up with PCP in 1-2 weeks  Home Health: none Equipment/Devices: none  Discharge Condition: stable CODE STATUS: Full code Diet recommendation: regular  HPI: Per admitting MD, Marc Morris is a 81 y.o. 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 Patient is poor historian, and I tried to reach patient's family: Wife's phone number does not work and left a message patient niece.  Patient provided some of the history and the rest of the history supplemented by ER record.  Patient reported that for 4 to 5 days he has had productive cough with yellowish sputum production, episode of chills but no fever.  He was admitted recently has started binge drinking, as much as to 240 ounces of beer a day.  Last drink was last night.  This morning, patient started to have tremors and unsteady gait and fell to the left side, denied any loss of consciousness or head injuries.  He also has been using breathing treatment around-the-clock with little help and then come to the hospital.  Hospital Course / Discharge diagnoses: Principal problem Acute on chronic hypoxic respiratory failure due to acute COPD exacerbation, underlying chronic hypoxic respiratory failure on 3 L at home-patient had significant wheezing, productive cough on admission and shortness of breath.  He was placed on doxycycline, steroids and breathing treatments.  He finished the course while hospitalized.  He has improved, his wheezing has resolved and he has returned to his baseline respiratory status requiring 3 L of  oxygen.  Active problems Alcohol withdrawal, ETOH abuse-patient triggered CIWA significantly in the first 4 days, but now much improved, no longer has significant withdrawals.  He does have an intentional tremor but I suspect that this is chronic in the setting of longstanding EtOH use Left-sided chest pain-due to his fall, x-ray without acute fractures.  Continue lidocaine patch, PT, add incentive spirometry, Essential hypertension-continue metoprolol, lisinopril Anxiety/depression-continue SSRI Paroxysmal A. fib-continue metoprolol, anticoagulation with Xarelto Frequent falls, deconditioning-SNF recommended Normocytic anemia-mild, perhaps related to EtOH use, also dilutional component.  Stable Hyponatremia-resolved, sodium normalized  Sepsis ruled out   Discharge Instructions   Allergies as of 09/06/2021   No Known Allergies      Medication List     TAKE these medications    ALPRAZolam 1 MG tablet Commonly known as: XANAX Take 1 tablet (1 mg total) by mouth 2 (two) times daily as needed. For anxiety   chlordiazePOXIDE 5 MG capsule Commonly known as: LIBRIUM Take 1 capsule (5 mg total) by mouth 3 (three) times daily.   Daliresp 500 MCG Tabs tablet Generic drug: roflumilast Take 500 mcg by mouth daily.   Dexilant 60 MG capsule Generic drug: dexlansoprazole Take 1 capsule (60 mg total) by mouth daily.   donepezil 5 MG tablet Commonly known as: ARICEPT Take 1 tablet (5 mg total) by mouth daily.   FLUoxetine 20 MG capsule Commonly known as: PROZAC Take 1 capsule (20 mg total) by mouth daily.   folic acid 1 MG tablet Commonly known as: FOLVITE Take 1 tablet (1 mg total) by mouth daily.   ipratropium-albuterol 0.5-2.5 (3) MG/3ML Soln Commonly known as: DUONEB  Take 3 mLs by nebulization every 4 (four) hours as needed (shortness of breath).   lidocaine 5 % Commonly known as: LIDODERM Place 1 patch onto the skin daily. Remove & Discard patch within 12 hours or as  directed by MD   lisinopril 5 MG tablet Commonly known as: ZESTRIL Take 1 tablet (5 mg total) by mouth daily. May take a half tab if blood pressure is too low   metoprolol tartrate 50 MG tablet Commonly known as: LOPRESSOR Take 0.5 tablets (25 mg total) by mouth 2 (two) times daily.   mirtazapine 15 MG tablet Commonly known as: REMERON Take 1 tablet (15 mg total) by mouth daily.   montelukast 10 MG tablet Commonly known as: SINGULAIR Take 1 tablet (10 mg total) by mouth daily.   omeprazole 20 MG capsule Commonly known as: PRILOSEC Take 20 mg by mouth 2 (two) times daily.   pravastatin 40 MG tablet Commonly known as: PRAVACHOL Take 1 tablet (40 mg total) by mouth daily.   ProAir HFA 108 (90 Base) MCG/ACT inhaler Generic drug: albuterol Inhale 2 puffs into the lungs as needed for wheezing or shortness of breath.   Symbicort 160-4.5 MCG/ACT inhaler Generic drug: budesonide-formoterol Inhale 1 puff into the lungs daily.   tamsulosin 0.4 MG Caps capsule Commonly known as: FLOMAX Take 0.4 mg by mouth daily.   thiamine 100 MG tablet Take 1 tablet (100 mg total) by mouth daily.   Xarelto 20 MG Tabs tablet Generic drug: rivaroxaban Take 1 tablet (20 mg total) by mouth daily.        Consultations: none  Procedures/Studies:  DG Ribs Unilateral W/Chest Left  Result Date: 08/29/2021 CLINICAL DATA:  Left lateral rib pain after fall last Thursday. EXAM: LEFT RIBS AND CHEST - 3+ VIEW COMPARISON:  Chest x-ray dated July 31, 2021. FINDINGS: No fracture or other bone lesions are seen involving the ribs. The heart size and mediastinal contours are within normal limits. Similar emphysematous changes with chronic scarring at the lung bases. No focal consolidation, pleural effusion, or pneumothorax. Air within the colon under the right hemidiaphragm. IMPRESSION: 1. No acute rib fracture. 2. COPD. No acute cardiopulmonary disease. Electronically Signed   By: Titus Dubin M.D.    On: 08/29/2021 10:13   CT Head Wo Contrast  Result Date: 08/29/2021 CLINICAL DATA:  Head and neck trauma due to a fall today. EXAM: CT HEAD WITHOUT CONTRAST CT CERVICAL SPINE WITHOUT CONTRAST TECHNIQUE: Multidetector CT imaging of the head and cervical spine was performed following the standard protocol without intravenous contrast. Multiplanar CT image reconstructions of the cervical spine were also generated. RADIATION DOSE REDUCTION: This exam was performed according to the departmental dose-optimization program which includes automated exposure control, adjustment of the mA and/or kV according to patient size and/or use of iterative reconstruction technique. COMPARISON:  CT head 06/07/2019. FINDINGS: CT HEAD FINDINGS Brain: The patient's head is tilted in the CT gantry. There is no evidence of acute intracranial hemorrhage, mass lesion, brain edema or extra-axial fluid collection. There is generalized atrophy with prominence of the ventricles and subarachnoid spaces. Mild chronic small vessel ischemic changes in the periventricular white matter are unchanged. There is no CT evidence of acute cortical infarction. Vascular: Prominent intracranial vascular calcifications. No hyperdense vessel identified. Skull: Negative for fracture or focal lesion. Sinuses/Orbits: The visualized paranasal sinuses and mastoid air cells are clear. No orbital abnormalities are seen. Other: None. CT CERVICAL SPINE FINDINGS Alignment: There is a convex right scoliosis with a mild  degenerative anterolisthesis at C5-6, C6-7 and C7-T1. Skull base and vertebrae: No evidence of acute cervical spine fracture or traumatic subluxation. There is multilevel spondylosis. Soft tissues and spinal canal: No prevertebral fluid or swelling. No visible canal hematoma. Disc levels: No large disc herniation identified. There is multilevel spondylosis with uncinate spurring and facet hypertrophy. Upper chest: Severe emphysema noted at both lung  apices. Other: Bilateral carotid atherosclerosis. IMPRESSION: 1. No acute intracranial or calvarial findings. 2. No evidence of acute cervical spine fracture, traumatic subluxation or static signs of instability. 3. Intracranial atrophy and mild chronic small vessel ischemic changes. 4. 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 5.  Emphysema (ICD10-J43.9). Electronically Signed   By: Richardean Sale M.D.   On: 08/29/2021 12:25   CT Cervical Spine Wo Contrast  Result Date: 08/29/2021 CLINICAL DATA:  Head and neck trauma due to a fall today. EXAM: CT HEAD WITHOUT CONTRAST CT CERVICAL SPINE WITHOUT CONTRAST TECHNIQUE: Multidetector CT imaging of the head and cervical spine was performed following the standard protocol without intravenous contrast. Multiplanar CT image reconstructions of the cervical spine were also generated. RADIATION DOSE REDUCTION: This exam was performed according to the departmental dose-optimization program which includes automated exposure control, adjustment of the mA and/or kV according to patient size and/or use of iterative reconstruction technique. COMPARISON:  CT head 06/07/2019. FINDINGS: CT HEAD FINDINGS Brain: The patient's head is tilted in the CT gantry. There is no evidence of acute intracranial hemorrhage, mass lesion, brain edema or extra-axial fluid collection. There is generalized atrophy with prominence of the ventricles and subarachnoid spaces. Mild chronic small vessel ischemic changes in the periventricular white matter are unchanged. There is no CT evidence of acute cortical infarction. Vascular: Prominent intracranial vascular calcifications. No hyperdense vessel identified. Skull: Negative for fracture or focal lesion. Sinuses/Orbits: The visualized paranasal sinuses and mastoid air cells are clear. No orbital abnormalities are seen. Other: None. CT CERVICAL SPINE FINDINGS Alignment: There is a convex right scoliosis with a mild degenerative  anterolisthesis at C5-6, C6-7 and C7-T1. Skull base and vertebrae: No evidence of acute cervical spine fracture or traumatic subluxation. There is multilevel spondylosis. Soft tissues and spinal canal: No prevertebral fluid or swelling. No visible canal hematoma. Disc levels: No large disc herniation identified. There is multilevel spondylosis with uncinate spurring and facet hypertrophy. Upper chest: Severe emphysema noted at both lung apices. Other: Bilateral carotid atherosclerosis. IMPRESSION: 1. No acute intracranial or calvarial findings. 2. No evidence of acute cervical spine fracture, traumatic subluxation or static signs of instability. 3. Intracranial atrophy and mild chronic small vessel ischemic changes. 4. 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 5.  Emphysema (ICD10-J43.9). Electronically Signed   By: Richardean Sale M.D.   On: 08/29/2021 12:25     Subjective: - no chest pain, shortness of breath, no abdominal pain, nausea or vomiting.   Discharge Exam: BP 131/73 (BP Location: Right Arm)    Pulse (!) 57    Temp 97.7 F (36.5 C) (Oral)    Resp 17    Ht 5\' 3"  (1.6 m)    Wt 56.4 kg    SpO2 99%    BMI 22.04 kg/m   General: Pt is alert, awake, not in acute distress Cardiovascular: RRR, S1/S2 +, no rubs, no gallops Respiratory: CTA bilaterally, no wheezing, no rhonchi Abdominal: Soft, NT, ND, bowel sounds + Extremities: no edema, no cyanosis   The results of significant diagnostics from this hospitalization (including imaging, microbiology, ancillary and laboratory) are  listed below for reference.     Microbiology: Recent Results (from the past 240 hour(s))  Resp Panel by RT-PCR (Flu A&B, Covid) Nasopharyngeal Swab     Status: None   Collection Time: 09/05/21  4:37 PM   Specimen: Nasopharyngeal Swab; Nasopharyngeal(NP) swabs in vial transport medium  Result Value Ref Range Status   SARS Coronavirus 2 by RT PCR NEGATIVE NEGATIVE Final    Comment:  (NOTE) SARS-CoV-2 target nucleic acids are NOT DETECTED.  The SARS-CoV-2 RNA is generally detectable in upper respiratory specimens during the acute phase of infection. The lowest concentration of SARS-CoV-2 viral copies this assay can detect is 138 copies/mL. A negative result does not preclude SARS-Cov-2 infection and should not be used as the sole basis for treatment or other patient management decisions. A negative result may occur with  improper specimen collection/handling, submission of specimen other than nasopharyngeal swab, presence of viral mutation(s) within the areas targeted by this assay, and inadequate number of viral copies(<138 copies/mL). A negative result must be combined with clinical observations, patient history, and epidemiological information. The expected result is Negative.  Fact Sheet for Patients:  EntrepreneurPulse.com.au  Fact Sheet for Healthcare Providers:  IncredibleEmployment.be  This test is no t yet approved or cleared by the [place] FDA and  has been authorized for detection and/or diagnosis of SARS-CoV-2 by FDA under an Emergency Use Authorization (EUA). This EUA will remain  in effect (meaning this test can be used) for the duration of the COVID-19 declaration under Section 564(b)(1) of the Act, 21 U.S.C.section 360bbb-3(b)(1), unless the authorization is terminated  or revoked sooner.       Influenza A by PCR NEGATIVE NEGATIVE Final   Influenza B by PCR NEGATIVE NEGATIVE Final    Comment: (NOTE) The Xpert Xpress SARS-CoV-2/FLU/RSV plus assay is intended as an aid in the diagnosis of influenza from Nasopharyngeal swab specimens and should not be used as a sole basis for treatment. Nasal washings and aspirates are unacceptable for Xpert Xpress SARS-CoV-2/FLU/RSV testing.  Fact Sheet for Patients: EntrepreneurPulse.com.au  Fact Sheet for Healthcare  Providers: IncredibleEmployment.be  This test is not yet approved or cleared by the [place] FDA and has been authorized for detection and/or diagnosis of SARS-CoV-2 by FDA under an Emergency Use Authorization (EUA). This EUA will remain in effect (meaning this test can be used) for the duration of the COVID-19 declaration under Section 564(b)(1) of the Act, 21 U.S.C. section 360bbb-3(b)(1), unless the authorization is terminated or revoked.  Performed at [facility], [place] [address] [place], [place] [address]      Labs: Basic Metabolic Panel: Recent Labs  Lab 09/01/21 0046 09/04/21 0043  NA 135 137  K 3.9 4.3  CL 97* 101  CO2 25 25  GLUCOSE 102* 99  BUN 24* 26*  CREATININE 1.15 0.79  CALCIUM 9.1 9.2  MG  --  QUANTITY NOT SUFFICIENT, UNABLE TO PERFORM TEST   Liver Function Tests: Recent Labs  Lab 09/01/21 0046 09/04/21 0043  AST 22 20  ALT 18 QUANTITY NOT SUFFICIENT, UNABLE TO PERFORM TEST  ALKPHOS 71 57  BILITOT 0.4 QUANTITY NOT SUFFICIENT, UNABLE TO PERFORM TEST  PROT 6.4* 6.5  ALBUMIN 3.3* 3.3*   CBC: Recent Labs  Lab 09/01/21 0046 09/04/21 0043  WBC 9.3 8.3  HGB 11.1* 11.3*  HCT 32.7* 34.9*  MCV 85.2 86.8  PLT 251 294   CBG: No results for input(s): GLUCAP in the last 168 hours. Hgb A1c No results  for input(s): HGBA1C in the last 72 hours. Lipid Profile No results for input(s): CHOL, HDL, LDLCALC, TRIG, CHOLHDL, LDLDIRECT in the last 72 hours. Thyroid function studies No results for input(s): TSH, T4TOTAL, T3FREE, THYROIDAB in the last 72 hours.  Invalid input(s): FREET3 Urinalysis    Component Value Date/Time   COLORURINE YELLOW 08/29/2021 [place] 08/29/2021 1830   LABSPEC 1.020 08/29/2021 1830   PHURINE 6.0 08/29/2021 1830   GLUCOSEU NEGATIVE 08/29/2021 1830   HGBUR TRACE (A) 08/29/2021 1830   BILIRUBINUR NEGATIVE 08/29/2021 1830   KETONESUR 15 (A) 08/29/2021 1830   PROTEINUR NEGATIVE  08/29/2021 1830   NITRITE NEGATIVE 08/29/2021 1830   LEUKOCYTESUR NEGATIVE 08/29/2021 1830    FURTHER DISCHARGE INSTRUCTIONS:   Get Medicines reviewed and adjusted: Please take all your medications with you for your next visit with your Primary MD   Laboratory/radiological data: Please request your Primary MD to go over all hospital tests and procedure/radiological results at the follow up, please ask your Primary MD to get all Hospital records sent to his/her office.   In some cases, they will be blood work, cultures and biopsy results pending at the time of your discharge. Please request that your primary care M.D. goes through all the records of your hospital data and follows up on these results.   Also Note the following: If you experience worsening of your admission symptoms, develop shortness of breath, life threatening emergency, suicidal or homicidal thoughts you must seek medical attention immediately by calling 911 or calling your MD immediately  if symptoms less severe.   You must read complete instructions/literature along with all the possible adverse reactions/side effects for all the Medicines you take and that have been prescribed to you. Take any new Medicines after you have completely understood and accpet all the possible adverse reactions/side effects.    Do not drive when taking Pain medications or sleeping medications (Benzodaizepines)   Do not take more than prescribed Pain, Sleep and Anxiety Medications. It is not advisable to combine anxiety,sleep and pain medications without talking with your primary care practitioner   Special Instructions: If you have smoked or chewed Tobacco  in the last 2 yrs please stop smoking, stop any regular Alcohol  and or any Recreational drug use.   Wear Seat belts while driving.   Please note: You were cared for by a hospitalist during your hospital stay. Once you are discharged, your primary care physician will handle any further  medical issues. Please note that NO REFILLS for any discharge medications will be authorized once you are discharged, as it is imperative that you return to your primary care physician (or establish a relationship with a primary care physician if you do not have one) for your post hospital discharge needs so that they can reassess your need for medications and monitor your lab values.  Time coordinating discharge: 40 minutes  SIGNED:  Marzetta Board, MD, PhD 09/06/2021, 9:42 AM

## 2021-09-06 NOTE — Progress Notes (Signed)
Occupational Therapy Treatment Patient Details Name: Marc Morris MRN: [id number] DOB: 19-Nov-1940 Today's Date: 09/06/2021   History of present illness Pt is a 81 y/o male admitted secondary to falls and COPD exacerbation. Pt with L sided rib pain, however, imaging negative. PMH includes COPD, alcohol abuse, HTN and a fib.   OT comments  Pt limited by L flank pain at rest. Politely refused EOB/OOB. Nursing notified. Bed level UE/LE exercises completed. D/c plan remains appropriate.   Recommendations for follow up therapy are one component of a multi-disciplinary discharge planning process, led by the attending physician.  Recommendations may be updated based on patient status, additional functional criteria and insurance authorization.    Follow Up Recommendations  Skilled nursing-short term rehab (<3 hours/day)    Assistance Recommended at Discharge Frequent or constant Supervision/Assistance  Patient can return home with the following  A little help with walking and/or transfers;A lot of help with bathing/dressing/bathroom;Assistance with cooking/housework;Direct supervision/assist for medications management;Direct supervision/assist for financial management;Assist for transportation;Help with stairs or ramp for entrance   Equipment Recommendations  BSC/3in1    Recommendations for Other Services      Precautions / Restrictions Precautions Precautions: Fall Restrictions Weight Bearing Restrictions: No       Mobility Bed Mobility               General bed mobility comments: pt declined EOB this session citing L flank pain at rest    Transfers                   General transfer comment: pt declined citing L flank pain     Balance                                           ADL either performed or assessed with clinical judgement   ADL Overall ADL's : Needs assistance/impaired                                        General ADL Comments: Limited participation due to pain.    Extremity/Trunk Assessment Upper Extremity Assessment Upper Extremity Assessment: Generalized weakness   Lower Extremity Assessment Lower Extremity Assessment: Defer to PT evaluation        Vision       Perception     Praxis      Cognition Arousal/Alertness: Awake/alert Behavior During Therapy: Flat affect Overall Cognitive Status: No family/caregiver present to determine baseline cognitive functioning                                 General Comments: Slow processing. HOH.        Exercises Exercises: Other exercises Other Exercises Other Exercises: leg raises 10x each Other Exercises: arm raises r>L d/t L flank pain Other Exercises: general leg strengthening exercises at bed level, leg squats in supine position pushing into therapist arms/trunk    Shoulder Instructions       General Comments      Pertinent Vitals/ Pain       Pain Assessment Pain Assessment: Faces Faces Pain Scale: Hurts whole lot Pain Location: L ribs, esp with cough Pain Descriptors / Indicators: Grimacing, Guarding, Sharp Pain Intervention(s): Monitored during session, Limited activity within patient's  tolerance, Repositioned, Patient requesting pain meds-RN notified  Home Living                                          Prior Functioning/Environment              Frequency  Min 2X/week        Progress Toward Goals  OT Goals(current goals can now be found in the care plan section)  Progress towards OT goals: Not progressing toward goals - comment (limited by pain)  Acute Rehab OT Goals OT Goal Formulation: With patient Time For Goal Achievement: 09/14/21 Potential to Achieve Goals: Good ADL Goals Pt Will Perform Grooming: with min assist;standing Pt Will Perform Upper Body Dressing: with min assist;sitting Pt Will Perform Lower Body Dressing: with min assist;sit to/from stand Pt  Will Transfer to Toilet: with min assist;ambulating;bedside commode Pt Will Perform Toileting - Clothing Manipulation and hygiene: with min assist;sit to/from stand Additional ADL Goal #1: Pt will perform bed mobility modified independently in preparation for ADL. Additional ADL Goal #2: Pt will participate in formal cognitive screening.  Plan Discharge plan remains appropriate    Co-evaluation                 AM-PAC OT "6 Clicks" Daily Activity     Outcome Measure   Help from another person eating meals?: A Little Help from another person taking care of personal grooming?: A Little Help from another person toileting, which includes using toliet, bedpan, or urinal?: A Lot Help from another person bathing (including washing, rinsing, drying)?: A Lot Help from another person to put on and taking off regular upper body clothing?: A Lot Help from another person to put on and taking off regular lower body clothing?: A Lot 6 Click Score: 14    End of Session    OT Visit Diagnosis: Pain;Muscle weakness (generalized) (M62.81);Other symptoms and signs involving cognitive function   Activity Tolerance Patient limited by pain;Patient limited by fatigue   Patient Left in bed;with call bell/phone within reach;with bed alarm set   Nurse Communication Patient requests pain meds        Time: 4098-1191 OT Time Calculation (min): 14 min  Charges: OT General Charges $OT Visit: 1 Visit OT Treatments $Therapeutic Activity: 8-22 mins  Raynald Kemp, OT Acute Rehabilitation Services Office: [phone]   Pilar Grammes 09/06/2021, 11:42 AM

## 2021-10-12 DIAGNOSIS — J9602 Acute respiratory failure with hypercapnia: Secondary | ICD-10-CM

## 2022-12-24 IMAGING — CR DG RIBS W/ CHEST 3+V*L*
3 series · 3 of 3 positions shown · non-contrast
Comparison: Chest x-ray dated July 31, 2021.

CLINICAL DATA: Left lateral rib pain after fall [REDACTED].

EXAM:
LEFT RIBS AND CHEST - 3+ VIEW

[chest pa]
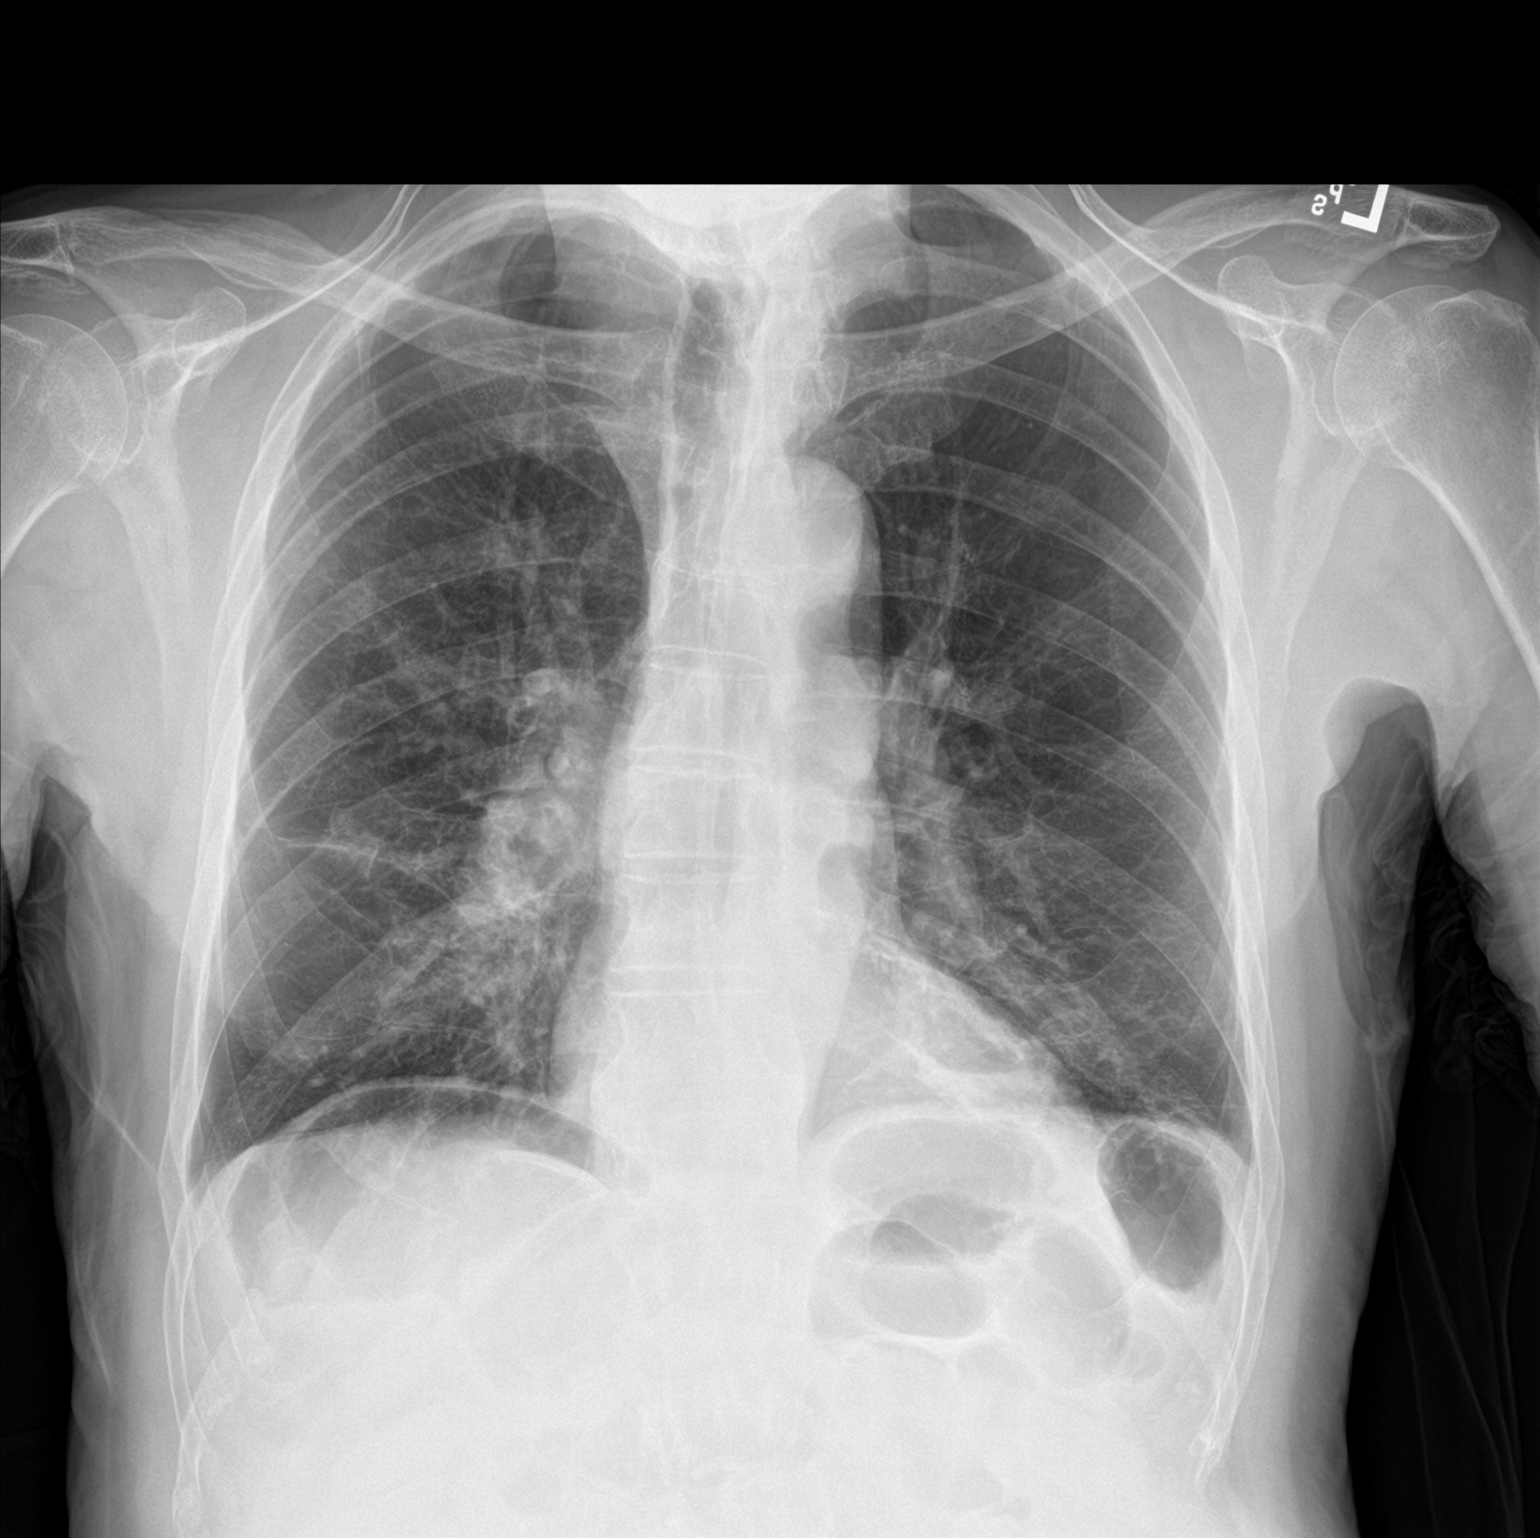

[rib ap]
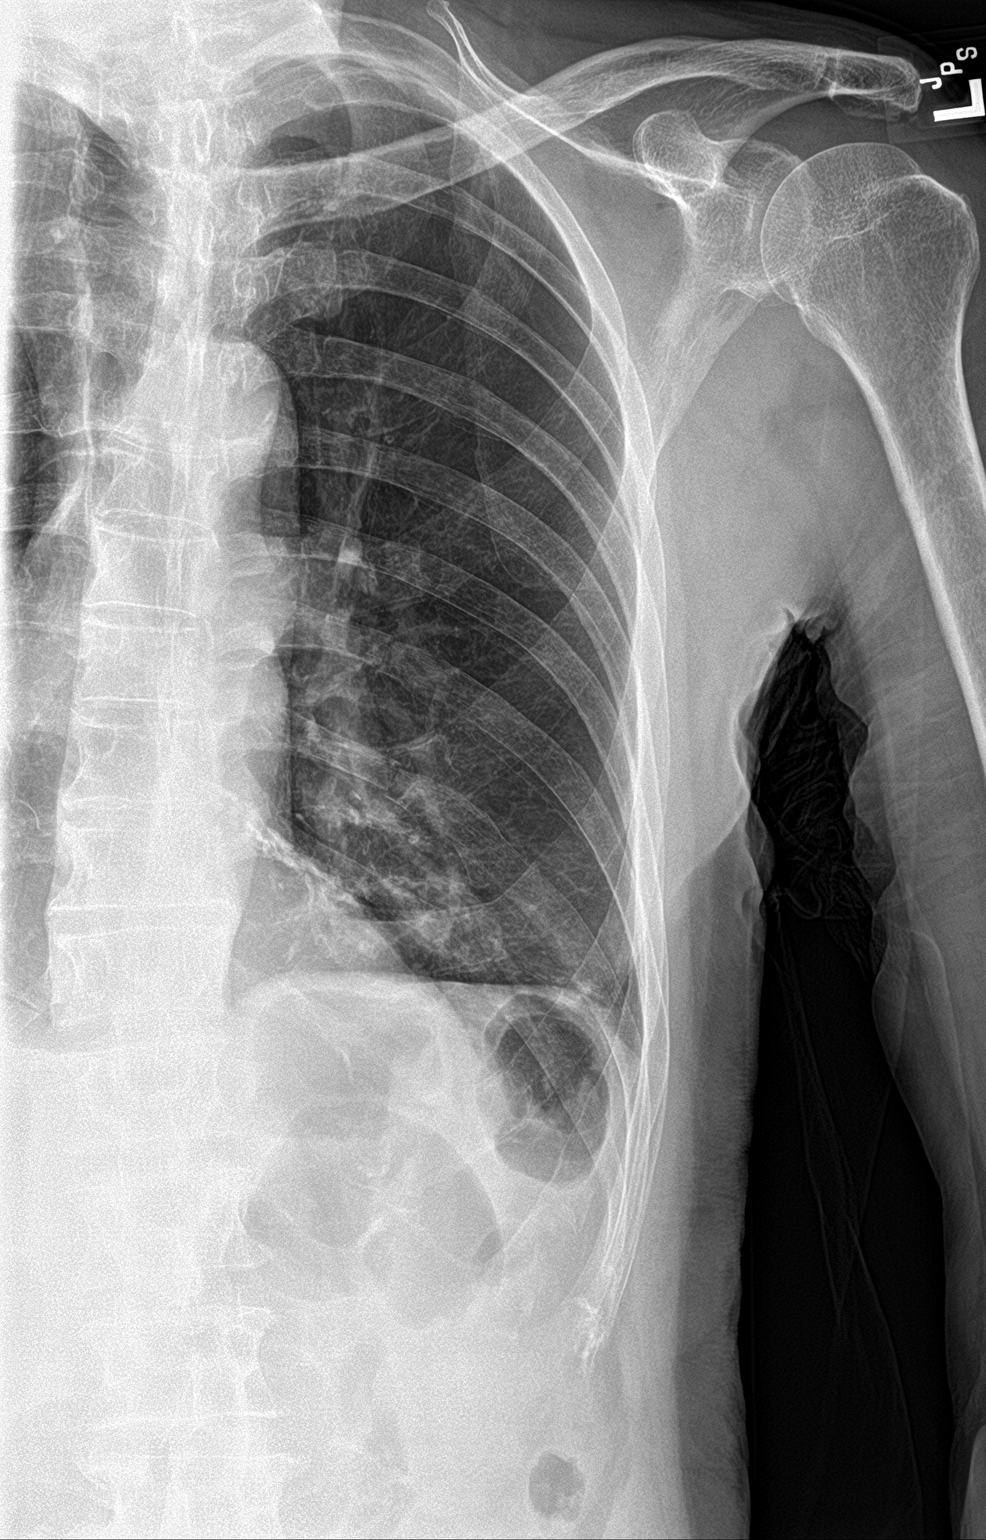

[rib ap obl]
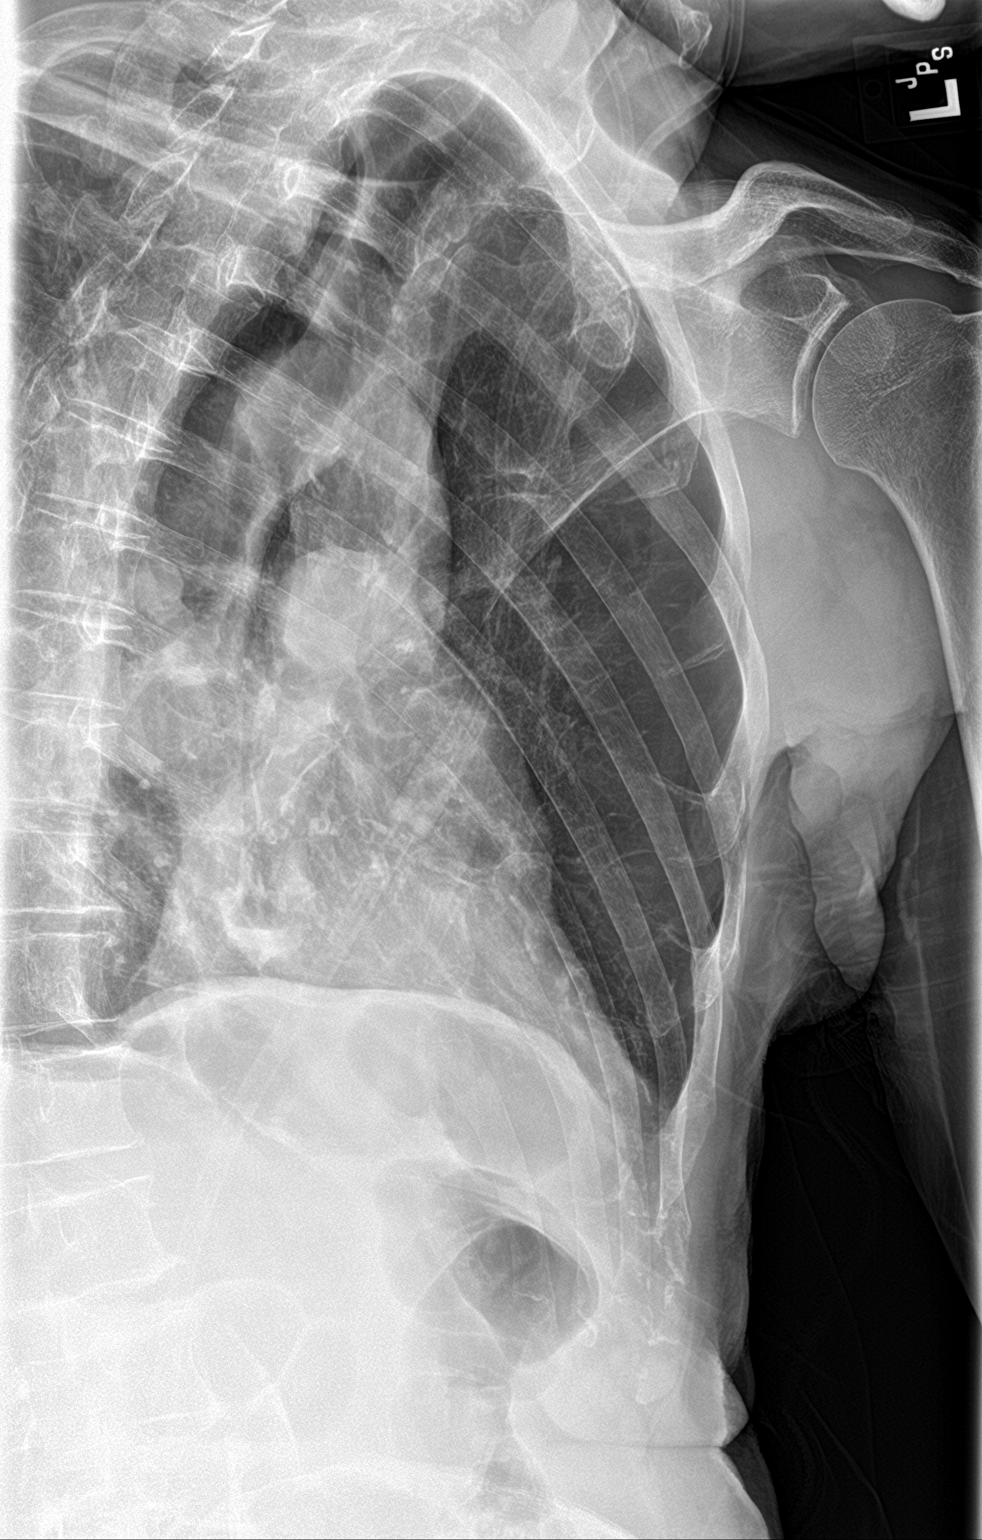

[3 of 3 positions shown; findings below may reference images not displayed]

FINDINGS: No fracture or other bone lesions are seen involving the ribs.

The heart size and mediastinal contours are within normal limits.
Similar emphysematous changes with chronic scarring at the lung
bases. No focal consolidation, pleural effusion, or pneumothorax.
Air within the colon under the right hemidiaphragm.
IMPRESSION: 1. No acute rib fracture.
2. COPD. No acute cardiopulmonary disease.

## 2022-12-24 IMAGING — CT CT HEAD W/O CM
5 of 6 series · 15 of 47 positions shown, 16 images · non-contrast
Comparison: CT head 06/07/2019.

CLINICAL DATA: Head and neck trauma due to a fall today.



[Series 4: head wo · axial · 0.45mm/px · z∈[-150,-100]mm · 2 of 30 slices shown (1 of 2)]
[im 10/30  brain]
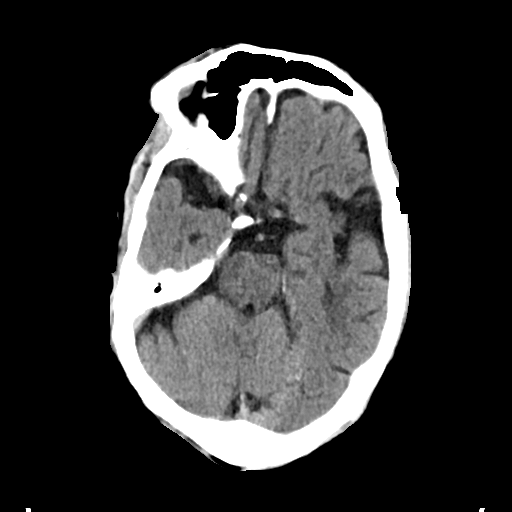
[im 20/30  brain]
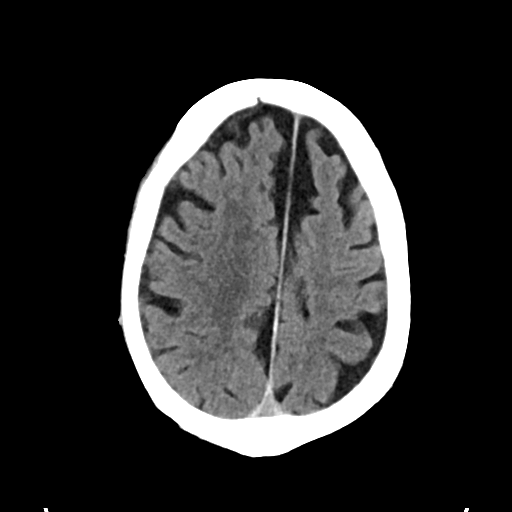

[Series 5: cor soft · coronal · 0.31mm/px · 3 of 69 slices shown]
[im 23/69  brain]
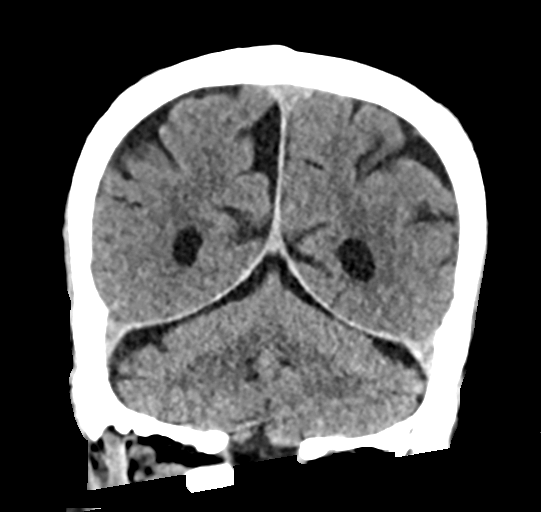
[im 31/69  brain]
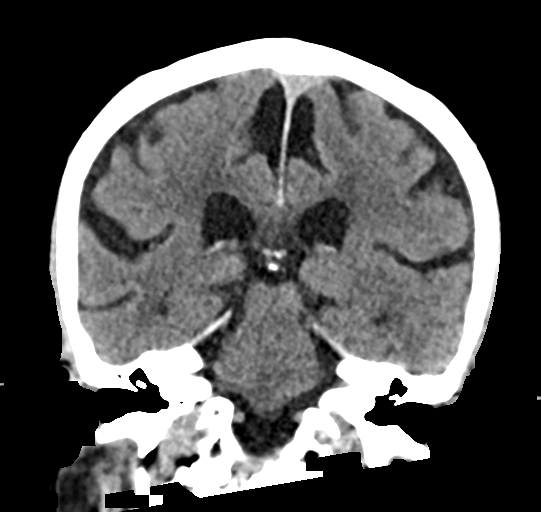
[im 38/69  brain]
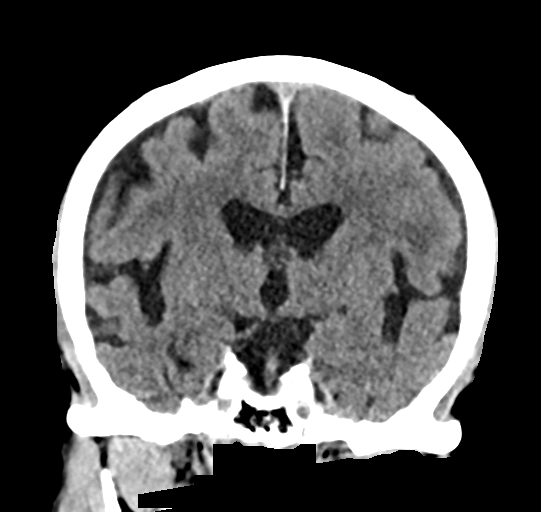

[Series 6: sag soft · sagittal · 0.33mm/px · 3 of 54 slices shown]
[im 21/54  brain]
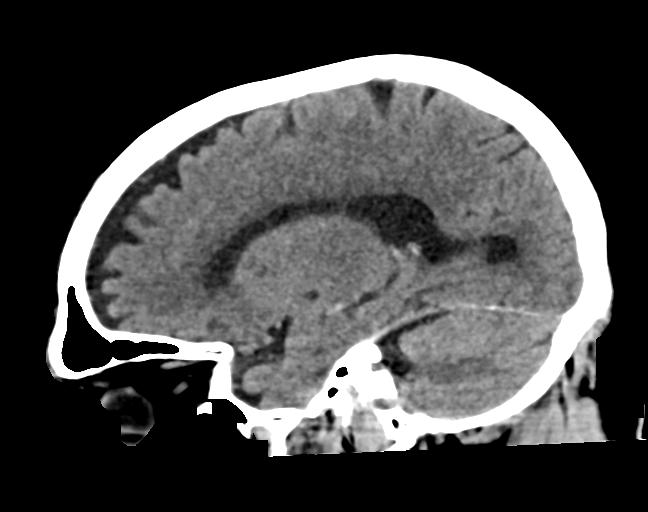
[im 27/54  brain]
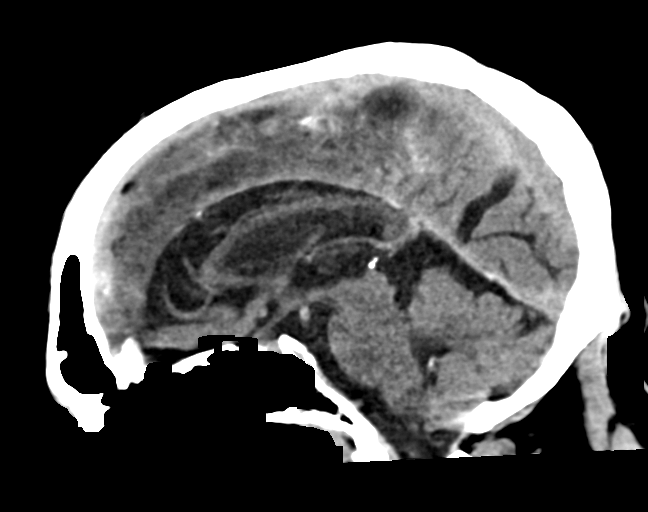
[im 34/54  brain]
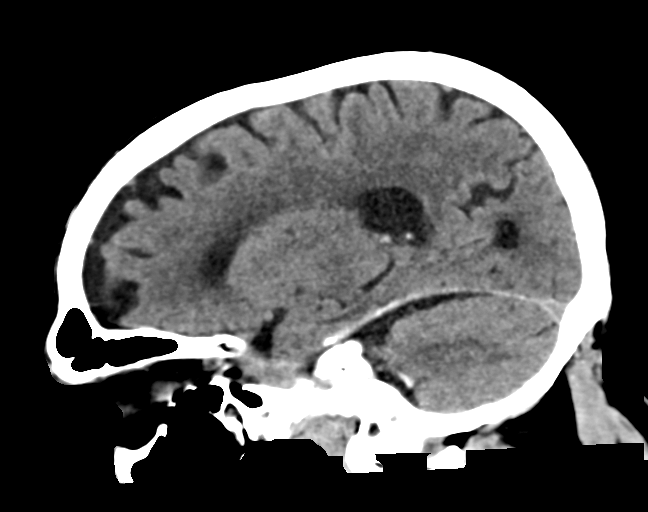

[Series 7: head wo · axial · 0.33mm/px · z∈[-138,-60]mm · 3 of 33 slices shown, 4 images (2 of 2)]
[im 9/33  brain]
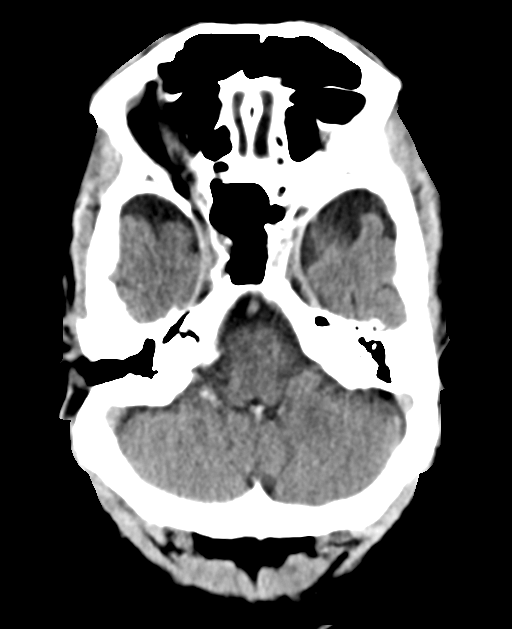
[im 9/33  bone]
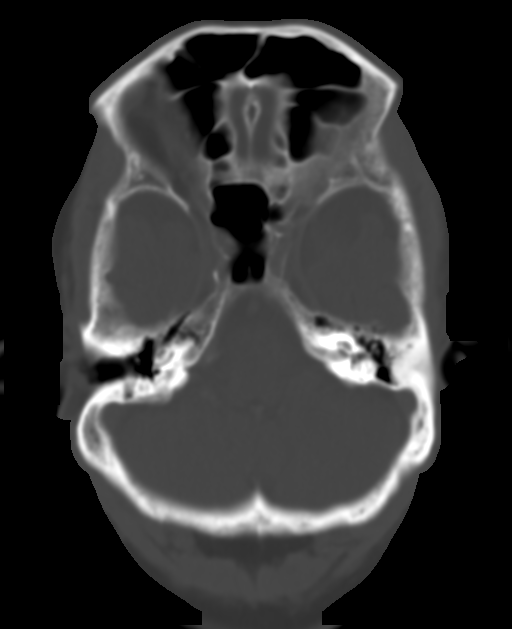
[im 17/33  brain]
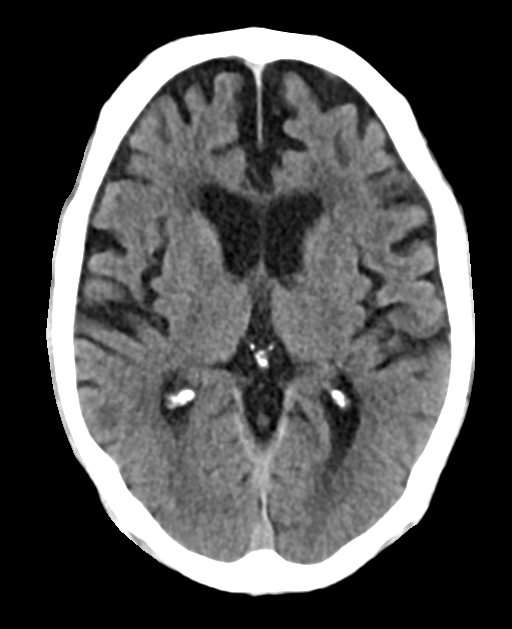
[im 25/33  brain]
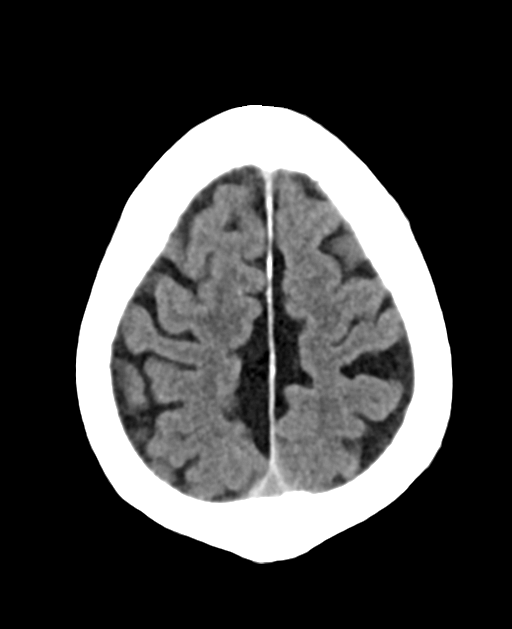

[Series 8: head bone · axial · 0.34mm/px · z∈[-171,-124]mm · 4 of 81 slices shown]
[im 9/81  bone]
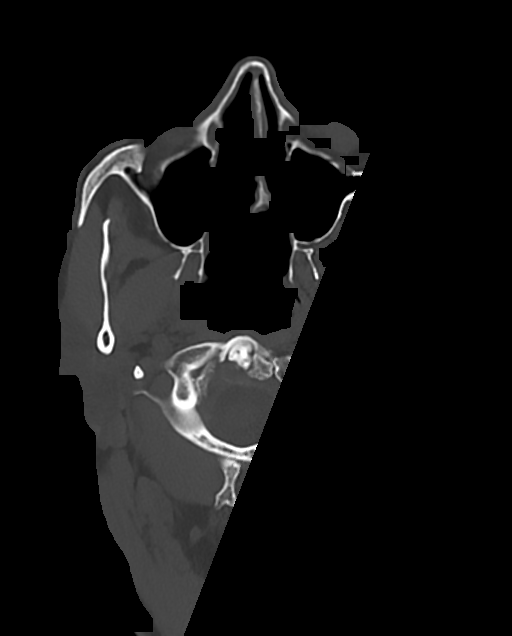
[im 17/81  bone]
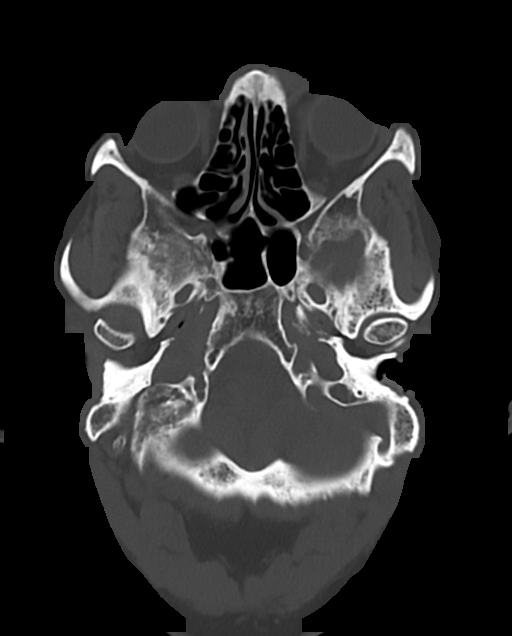
[im 25/81  bone]
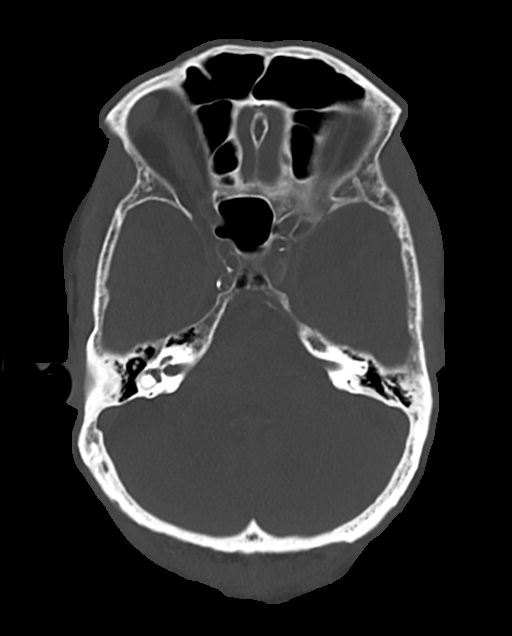
[im 33/81  bone]
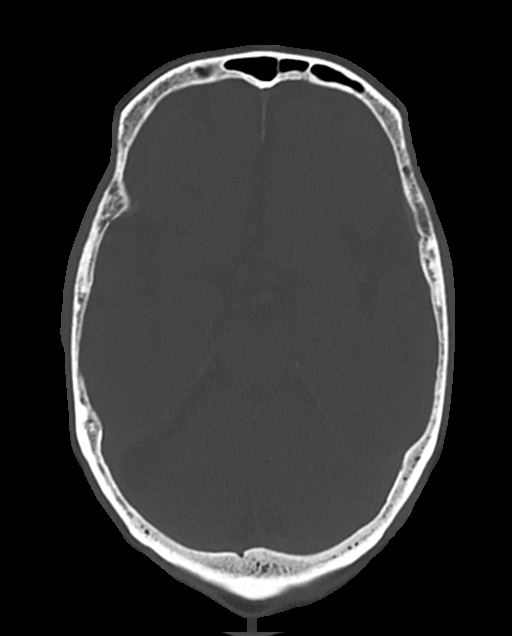

[15 of 47 positions shown; findings below may reference images not displayed]

FINDINGS: CT HEAD FINDINGS

Brain: The patient's head is tilted in the CT gantry. There is no
evidence of acute intracranial hemorrhage, mass lesion, brain edema
or extra-axial fluid collection. There is generalized atrophy with
prominence of the ventricles and subarachnoid spaces. Mild chronic
small vessel ischemic changes in the periventricular white matter
are unchanged. There is no CT evidence of acute cortical infarction.

Vascular: Prominent intracranial vascular calcifications. No
hyperdense vessel identified.

Skull: Negative for fracture or focal lesion.

Sinuses/Orbits: The visualized paranasal sinuses and mastoid air
cells are clear. No orbital abnormalities are seen.

Other: None.

CT CERVICAL SPINE FINDINGS

Alignment: There is a convex right scoliosis with a mild
degenerative anterolisthesis at C5-6, C6-7 and C7-T1.

Skull base and vertebrae: No evidence of acute cervical spine
fracture or traumatic subluxation. There is multilevel spondylosis.

Soft tissues and spinal canal: No prevertebral fluid or swelling. No
visible canal hematoma.

Disc levels: No large disc herniation identified. There is
multilevel spondylosis with uncinate spurring and facet hypertrophy.

Upper chest: Severe emphysema noted at both lung apices.

Other: Bilateral carotid atherosclerosis.
IMPRESSION: 1. No acute intracranial or calvarial findings.
2. No evidence of acute cervical spine fracture, traumatic
subluxation or static signs of instability.
3. Intracranial atrophy and mild chronic small vessel ischemic
changes.
4. Multilevel cervical spondylosis associated with a scoliosis.
5.  Emphysema ([id number]).

## 2023-05-15 DEATH — deceased

## 2023-06-15 DEATH — deceased
